# Patient Record
Sex: Male | Born: 1945
Health system: Southern US, Community
[De-identification: ages and names within clinical notes are randomized; demographics above are authoritative.]

## PROBLEM LIST (undated history)

## (undated) DIAGNOSIS — C61 Malignant neoplasm of prostate: Secondary | ICD-10-CM

## (undated) DIAGNOSIS — E785 Hyperlipidemia, unspecified: Secondary | ICD-10-CM

## (undated) DIAGNOSIS — I10 Essential (primary) hypertension: Secondary | ICD-10-CM

## (undated) HISTORY — PX: FRACTURE SURGERY: SHX138

## (undated) HISTORY — DX: Essential (primary) hypertension: I10

## (undated) HISTORY — DX: Hyperlipidemia, unspecified: E78.5

---

## 2001-08-02 ENCOUNTER — Encounter: Payer: Self-pay | Admitting: Family Medicine

## 2001-08-02 ENCOUNTER — Encounter: Admission: RE | Admit: 2001-08-02 | Discharge: 2001-08-02 | Payer: Self-pay | Admitting: Family Medicine

## 2003-12-05 ENCOUNTER — Ambulatory Visit: Payer: Self-pay | Admitting: Internal Medicine

## 2004-01-08 ENCOUNTER — Ambulatory Visit: Payer: Self-pay | Admitting: Internal Medicine

## 2009-02-04 ENCOUNTER — Ambulatory Visit: Payer: Self-pay | Admitting: Internal Medicine

## 2009-02-04 LAB — CONVERTED CEMR LAB
ALT: 26 units/L (ref 0–53)
AST: 21 units/L (ref 0–37)
Albumin: 4 g/dL (ref 3.5–5.2)
Alkaline Phosphatase: 55 units/L (ref 39–117)
BUN: 15 mg/dL (ref 6–23)
Basophils Absolute: 0 10*3/uL (ref 0.0–0.1)
Basophils Relative: 0 % (ref 0.0–3.0)
Bilirubin Urine: NEGATIVE
Bilirubin, Direct: 0.1 mg/dL (ref 0.0–0.3)
CO2: 30 meq/L (ref 19–32)
Calcium: 9 mg/dL (ref 8.4–10.5)
Chloride: 104 meq/L (ref 96–112)
Cholesterol: 258 mg/dL — ABNORMAL HIGH (ref 0–200)
Creatinine, Ser: 1.1 mg/dL (ref 0.4–1.5)
Direct LDL: 187.8 mg/dL
Eosinophils Absolute: 0.1 10*3/uL (ref 0.0–0.7)
Eosinophils Relative: 1.5 % (ref 0.0–5.0)
GFR calc non Af Amer: 72.2 mL/min (ref >60)
Glucose, Bld: 106 mg/dL — ABNORMAL HIGH (ref 70–99)
HCT: 48.3 % (ref 39.0–52.0)
HDL: 49.3 mg/dL (ref >39.00)
Hemoglobin, Urine: NEGATIVE
Hemoglobin: 16.8 g/dL (ref 13.0–17.0)
Ketones, ur: NEGATIVE mg/dL
Leukocytes, UA: NEGATIVE
Lymphocytes Relative: 19.2 % (ref 12.0–46.0)
Lymphs Abs: 1.2 10*3/uL (ref 0.7–4.0)
MCHC: 34.7 g/dL (ref 30.0–36.0)
MCV: 98.1 fL (ref 78.0–100.0)
Monocytes Absolute: 0.3 10*3/uL (ref 0.1–1.0)
Monocytes Relative: 5.3 % (ref 3.0–12.0)
Neutro Abs: 4.6 10*3/uL (ref 1.4–7.7)
Neutrophils Relative %: 74 % (ref 43.0–77.0)
Nitrite: NEGATIVE
Platelets: 191 10*3/uL (ref 150.0–400.0)
Potassium: 4.8 meq/L (ref 3.5–5.1)
RBC: 4.93 M/uL (ref 4.22–5.81)
RDW: 11.7 % (ref 11.5–14.6)
Sodium: 139 meq/L (ref 135–145)
Specific Gravity, Urine: 1.01 (ref 1.000–1.030)
TSH: 2.11 microintl units/mL (ref 0.35–5.50)
Total Bilirubin: 0.7 mg/dL (ref 0.3–1.2)
Total CHOL/HDL Ratio: 5
Total Protein, Urine: NEGATIVE mg/dL
Total Protein: 6.9 g/dL (ref 6.0–8.3)
Triglycerides: 107 mg/dL (ref 0.0–149.0)
Urine Glucose: NEGATIVE mg/dL
Urobilinogen, UA: 0.2 (ref 0.0–1.0)
VLDL: 21.4 mg/dL (ref 0.0–40.0)
WBC: 6.2 10*3/uL (ref 4.5–10.5)
pH: 7 (ref 5.0–8.0)

## 2009-02-08 ENCOUNTER — Ambulatory Visit: Payer: Self-pay | Admitting: Internal Medicine

## 2009-02-08 DIAGNOSIS — E785 Hyperlipidemia, unspecified: Secondary | ICD-10-CM

## 2009-02-08 DIAGNOSIS — I10 Essential (primary) hypertension: Secondary | ICD-10-CM | POA: Insufficient documentation

## 2010-03-04 NOTE — Assessment & Plan Note (Signed)
Summary: CPX / LOV 2005 /[facility]   #   Vital Signs:  Patient profile:   65 year old male Height:      70 inches Weight:      206 pounds BMI:     29.66 O2 Sat:      95 % on Room air Temp:     98.6 degrees F oral Pulse rate:   90 / minute BP sitting:   152 / 96  (left arm) Cuff size:   large  Vitals Entered By: Bill Salinas CMA (February 08, 2009 1:26 PM)  O2 Flow:  Room air CC: pt here for CPX/ ab   CC:  pt here for CPX/ ab.  History of Present Illness: Patient presents for routine medical follow-up. He was last seen by Me in '05. Interval history is unremarkable. He has been healthy.  Current Medications (verified): 1)  None  Allergies (verified): No Known Drug Allergies  Past History:  Past Medical History: UCD None  Past Surgical History: None  fx  left wrist and arm remotely  Family History: father - CAD/MI @ 62 mother - CAD/MI @ 35, DM Neg - colon or prostate cancer  Social History: HSG, College Married 3 yrs - divorced; married 24 years-divorced. Long-term monogamous relationship 2 dtrs - '70, '79 work - Hydrographic surveyor  Review of Systems  The patient denies anorexia, fever, weight loss, weight gain, vision loss, decreased hearing, hoarseness, chest pain, syncope, dyspnea on exertion, peripheral edema, prolonged cough, headaches, hemoptysis, abdominal pain, melena, hematochezia, severe indigestion/heartburn, hematuria, incontinence, muscle weakness, suspicious skin lesions, difficulty walking, depression, unusual weight change, abnormal bleeding, enlarged lymph nodes, and testicular masses.    Physical Exam  General:  Well-developed,well-nourished,in no acute distress; alert,appropriate and cooperative throughout examination Head:  Normocephalic and atraumatic without obvious abnormalities. No apparent alopecia or balding. Eyes:  No corneal or conjunctival inflammation noted. EOMI. Perrla. Funduscopic exam benign, without  hemorrhages, exudates or papilledema. Vision grossly normal. Ears:  External ear exam shows no significant lesions or deformities.  Otoscopic examination reveals clear canals, tympanic membranes are intact bilaterally without bulging, retraction, inflammation or discharge. Hearing is grossly normal bilaterally. Nose:  External nasal examination shows no deformity or inflammation. Nasal mucosa are pink and moist without lesions or exudates. Mouth:  Oral mucosa and oropharynx without lesions or exudates.  Teeth in good repair. Neck:  No deformities, masses, or tenderness noted. Chest Wall:  No deformities, masses, tenderness or gynecomastia noted. Lungs:  Normal respiratory effort, chest expands symmetrically. Lungs are clear to auscultation, no crackles or wheezes. Heart:  Normal rate and regular rhythm. S1 and S2 normal without gallop, murmur, click, rub or other extra sounds. Abdomen:  Bowel sounds positive,abdomen soft and non-tender without masses, organomegaly or hernias noted. Prostate:  Prostate gland firm and smooth, no enlargement, nodularity, tenderness, mass, asymmetry or induration. Msk:  No deformity or scoliosis noted of thoracic or lumbar spine.   Pulses:  R and L carotid,radial,femoral,dorsalis pedis and posterior tibial pulses are full and equal bilaterally Extremities:  No clubbing, cyanosis, edema, or deformity noted with normal full range of motion of all joints.   Neurologic:  No cranial nerve deficits noted. Station and gait are normal. Plantar reflexes are down-going bilaterally. DTRs are symmetrical throughout. Sensory, motor and coordinative functions appear intact. Skin:  Intact without suspicious lesions or rashes Cervical Nodes:  no anterior cervical adenopathy and no posterior cervical adenopathy.   Psych:  Cognition and judgment appear intact.  Alert and cooperative with normal attention span and concentration. No apparent delusions, illusions,  hallucinations   Impression & Recommendations:  Problem # 1:  HYPERLIPIDEMIA (ICD-272.4) Patient with a very high LDL at 187. Discussed risks of high cholesterol. LDL in '05 158  Plan - attempt at life-style management, i.e. diet and exercise.            recheck lab in 3-4 months  Problem # 2:  ESSENTIAL HYPERTENSION (ICD-401.9) BP elevated and was elevated in the past  Plan - start therapy with beta-blocker           recheck BP in 3-4 weeks.   His updated medication list for this problem includes:    Toprol Xl 25 Mg Xr24h-tab (Metoprolol succinate) .Marland Kitchen... 1 by mouth once daily for blood pressure  Problem # 3:  Preventive Health Care (ICD-V70.0) Generally n good health. Labs except for cholesterol were normal. Will refer for colonosocopy. He declines immunization.  In summary  - a pleasant man who is medically stable who needs to work on lowering cholesterol and blood pressure.   Complete Medication List: 1)  Viagra 100 Mg Tabs (Sildenafil citrate) .Marland Kitchen.. 1 by mouth  as needed 2)  Toprol Xl 25 Mg Xr24h-tab (Metoprolol succinate) .Marland Kitchen.. 1 by mouth once daily for blood pressure   Patient: Benjamin Wilkerson Note: All result statuses are Final unless otherwise noted.  Tests: (1) BMP (METABOL)   Sodium                    139 mEq/L                   135-145   Potassium                 4.8 mEq/L                   3.5-5.1   Chloride                  104 mEq/L                   96-112   Carbon Dioxide            30 mEq/L                    19-32   Glucose              [H]  106 mg/dL                   16-10   BUN                       15 mg/dL                    9-60   Creatinine                1.1 mg/dL                   4.5-4.0   Calcium                   9.0 mg/dL                   9.8-11.9   GFR                       72.20 mL/min                >  60  Tests: (2) Hepatic/Liver Function Panel (HEPATIC)   Total Bilirubin           0.7 mg/dL                   1.1-9.1   Direct Bilirubin           0.1 mg/dL                   4.7-8.2   Alkaline Phosphatase      55 U/L                      39-117   AST                       21 U/L                      0-37   ALT                       26 U/L                      0-53   Total Protein             6.9 g/dL                    9.5-6.2   Albumin                   4.0 g/dL                    1.3-0.8  Tests: (3) TSH (TSH)   FastTSH                   2.11 uIU/mL                 0.35-5.50  Tests: (4) Lipid Panel (LIPID)   Cholesterol          [H]  258 mg/dL                   6-578     ATP III Classification            Desirable:  < 200 mg/dL                    Borderline High:  200 - 239 mg/dL               High:  > = 240 mg/dL   Triglycerides             107.0 mg/dL                 4.6-962.9     Normal:  <150 mg/dL     Borderline High:  528 - 199 mg/dL   HDL                       41.32 mg/dL                 >44.01   VLDL Cholesterol          21.4 mg/dL                  0.2-72.5  CHO/HDL Ratio:  CHD Risk  5                    Men          Women     1/2 Average Risk     3.4          3.3     Average Risk          5.0          4.4     2X Average Risk          9.6          7.1     3X Average Risk          15.0          11.0                           Tests: (5) CBC Platelet w/Diff (CBCD)   White Cell Count          6.2 K/uL                    4.5-10.5   Red Cell Count            4.93 Mil/uL                 4.22-5.81   Hemoglobin                16.8 g/dL                   81.1-91.4   Hematocrit                48.3 %                      39.0-52.0   MCV                       98.1 fl                     78.0-100.0   MCHC                      34.7 g/dL                   78.2-95.6   RDW                       11.7 %                      11.5-14.6   Platelet Count            191.0 K/uL                  150.0-400.0   Neutrophil %              74.0 %                      43.0-77.0   Lymphocyte %              19.2 %                       12.0-46.0   Monocyte %                5.3 %  3.0-12.0   Eosinophils%              1.5 %                       0.0-5.0   Basophils %               0.0 %                       0.0-3.0   Neutrophill Absolute      4.6 K/uL                    1.4-7.7   Lymphocyte Absolute       1.2 K/uL                    0.7-4.0   Monocyte Absolute         0.3 K/uL                    0.1-1.0  Eosinophils, Absolute                             0.1 K/uL                    0.0-0.7   Basophils Absolute        0.0 K/uL                    0.0-0.1  Tests: (6) UDip Only (UDIP)   Color                     LT. YELLOW       RANGE:  Yellow;Lt. Yellow   Clarity                   CLEAR                       Clear   Specific Gravity          1.010                       1.000 - 1.030   Urine Ph                  7.0                         5.0-8.0   Protein                   NEGATIVE                    Negative   Urine Glucose             NEGATIVE                    Negative   Ketones                   NEGATIVE                    Negative   Urine Bilirubin           NEGATIVE                    Negative   Blood  NEGATIVE                    Negative   Urobilinogen              0.2                         0.0 - 1.0   Leukocyte Esterace        NEGATIVE                    Negative   Nitrite                   NEGATIVE                    Negative  Tests: (7) Cholesterol LDL - Direct (DIRLDL)  Cholesterol LDL - Direct                             187.8 mg/dL     Optimal:  <782 mg/dL     Near or Above Optimal:  100-129 mg/dL     Borderline High:  956-213 mg/dL     High:  086-578 mg/dL     Very High:  >469 mg/dLPrescriptions: TOPROL XL 25 MG XR24H-TAB (METOPROLOL SUCCINATE) 1 by mouth once daily for blood pressure  #30 x 12   Entered and Authorized by:   Jacques Navy MD   Signed by:   Jacques Navy MD on 02/08/2009   Method used:   Print then Give to Patient    RxID:   [id number] VIAGRA 100 MG TABS (SILDENAFIL CITRATE) 1 by mouth  as needed  #6 x 12   Entered and Authorized by:   Jacques Navy MD   Signed by:   Jacques Navy MD on 02/08/2009   Method used:   Print then Give to Patient   RxID:   [id number]    Not Administered:    Influenza Vaccine not given due to: declined

## 2010-09-09 ENCOUNTER — Other Ambulatory Visit: Payer: Self-pay | Admitting: Internal Medicine

## 2011-02-25 ENCOUNTER — Other Ambulatory Visit: Payer: Self-pay | Admitting: Internal Medicine

## 2011-08-26 ENCOUNTER — Other Ambulatory Visit: Payer: Self-pay | Admitting: Internal Medicine

## 2011-11-23 ENCOUNTER — Ambulatory Visit (INDEPENDENT_AMBULATORY_CARE_PROVIDER_SITE_OTHER): Payer: PRIVATE HEALTH INSURANCE | Admitting: Internal Medicine

## 2011-11-23 ENCOUNTER — Encounter: Payer: Self-pay | Admitting: Internal Medicine

## 2011-11-23 VITALS — BP 132/92 | HR 90 | Temp 98.6°F | Resp 16 | Wt 215.0 lb

## 2011-11-23 DIAGNOSIS — N529 Male erectile dysfunction, unspecified: Secondary | ICD-10-CM

## 2011-11-23 DIAGNOSIS — E785 Hyperlipidemia, unspecified: Secondary | ICD-10-CM

## 2011-11-23 DIAGNOSIS — Z0000 Encounter for general adult medical examination without abnormal findings: Secondary | ICD-10-CM

## 2011-11-23 DIAGNOSIS — I10 Essential (primary) hypertension: Secondary | ICD-10-CM

## 2011-11-23 MED ORDER — TADALAFIL 20 MG PO TABS: 20.0000 mg | ORAL_TABLET | Freq: Every day | ORAL | Status: DC | PRN

## 2011-11-23 MED ORDER — METOPROLOL SUCCINATE ER 25 MG PO TB24: 50.0000 mg | ORAL_TABLET | Freq: Every day | ORAL | Status: DC

## 2011-11-23 NOTE — Patient Instructions (Addendum)
Physical exam is normal but there is a small palpable defect in the left inguinal canal. If there is a bulge more frequently and if it is larger that will needed elective evaluation. If there is a bulge that does not go back in (reduce) and is tender this is a medical emergency and will need immediate attention.   Blood pressure not to bad today but has been higher than goal. Plan is to increase the toprol Xl to 50 mg once a day. Watch for feeling really sluggish or having a heart rate lower than 65  ; if this happens we will readjust your medications.  Cholesterol - last checked in '11 - LDL was 187 - definitely in the treatment range, especially with both parents having had heart disease. Please forward to me copy of you up coming lab work - will base recommendations on that information.  You need to have a colonoscopy - let me know if you have coverage and want to proceed.  Immunization: you should have pneumonia vaccine and shingles vaccine.   Thanks for coming back. Don't forget to have all lab work and EKG forwarded to me.

## 2011-11-23 NOTE — Progress Notes (Signed)
Subjective:    Patient ID: Benjamin Wilkerson, male    DOB: Aug 29, 1945, 66 y.o.   MRN: [id number]  HPI Benjamin Wilkerson was last seen in Jan '11. He was to return for follow up of hyperlipidemia and hypertension but failed to return. He does report having annual physical exams at work, including lab work which has revealed continued elevated cholesterol. He has been feeling well. He has no other medical problems.  Past Medical History  Diagnosis Date  . Hyperlipidemia   . Hypertension    Past Surgical History  Procedure Date  . Fracture surgery     left wrist and arm   Family History  Problem Relation Age of Onset  . Heart disease Mother     CAD/MI  . Diabetes Mother   . Heart disease Father     CAD/MI  . Alcohol abuse Father   . Cancer Neg Hx   . COPD Neg Hx    History   Social History  . Marital Status: Divorced    Spouse Name: N/A    Number of Children: 2  . Years of Education: 16   Occupational History  . Environmental serivces -Surveyor, quantity    Social History Main Topics  . Smoking status: Never Smoker   . Smokeless tobacco: Never Used  . Alcohol Use: 3.5 - 7.0 oz/week    7-14 drink(s) per week  . Drug Use: No  . Sexually Active: Yes -- Male partner(s)   Other Topics Concern  . Not on file   Social History Narrative   HSG, COLLEGE. MARRIED 3 YEARS, DIVORCED,  MARRIED 80 YEARS - DIVORCED. LONG TERM MONOGAMOUS RELATIONSHIP. 2 DAUGHTERS  1970 ; 1979.  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     Current Outpatient Prescriptions on File Prior to Visit  Medication Sig Dispense Refill  . metoprolol succinate (TOPROL-XL) 25 MG 24 hr tablet TAKE 1 TABLET BY MOUTH EVERY DAY FOR BLOOD PRESSURE  30 tablet  5       Review of Systems Constitutional:  Negative for fever, chills, activity change and unexpected weight change.  HEENT:  Negative for hearing loss, ear pain, congestion, neck stiffness and postnasal drip. Negative for sore  throat or swallowing problems. Negative for dental complaints.   Eyes: Negative for vision loss or change in visual acuity.  Respiratory: Negative for chest tightness and wheezing. Negative for DOE.   Cardiovascular: Negative for chest pain or palpitations. No decreased exercise tolerance Gastrointestinal: No change in bowel habit. No bloating or gas. No reflux or indigestion Genitourinary: Negative for urgency, frequency, flank pain and difficulty urinating.  Musculoskeletal: Negative for myalgias, back pain, arthralgias and gait problem.  Neurological: Negative for dizziness, tremors, weakness and headaches.  Hematological: Negative for adenopathy.  Psychiatric/Behavioral: Negative for behavioral problems and dysphoric mood.       Objective:   Physical Exam Filed Vitals:   11/23/11 1316  BP: 132/92  Pulse: 90  Temp: 98.6 F (37 C)  Resp: 16   Wt Readings from Last 3 Encounters:  11/23/11 215 lb (97.523 kg)  02/08/09 206 lb (93.441 kg)   Gen'l: Well nourished well developed white male in no acute distress  HEENT: Head: Normocephalic and atraumatic. Right Ear: External ear normal. EAC/TM nl. Left Ear: External ear normal.  EAC/TM nl. Nose: Nose normal. Mouth/Throat: Oropharynx is clear and moist. Dentition - native, in good repair. No buccal or palatal lesions. Posterior pharynx clear. Eyes: Conjunctivae and sclera clear. EOM intact. Pupils  are equal, round, and reactive to light. Right eye exhibits no discharge. Left eye exhibits no discharge. Neck: Normal range of motion. Neck supple. No JVD present. No tracheal deviation present. No thyromegaly present.  Cardiovascular: Normal rate, regular rhythm, no gallop, no friction rub, no murmur heard.      Quiet precordium. 2+ radial and DP pulses . No carotid bruits Pulmonary/Chest: Effort normal. No respiratory distress or increased WOB, no wheezes, no rales. No chest wall deformity or CVAT. Abdomen: Soft. Bowel sounds are normal in all  quadrants. He exhibits no distension, no tenderness, no rebound or guarding, No heptosplenomegaly  Genitourinary:  normal phallus, small inguinal defect left on digital exam w/o tenderness Musculoskeletal: Normal range of motion. He exhibits no edema and no tenderness.       Small and large joints without redness, synovial thickening or deformity. Full range of motion preserved about all small, median and large joints.  Lymphadenopathy:    He has no cervical or supraclavicular adenopathy.  Neurological: He is alert and oriented to person, place, and time. CN II-XII intact. DTRs 2+ and symmetrical biceps, radial and patellar tendons. Cerebellar function normal with no tremor, rigidity, normal gait and station.  Skin: Skin is warm and dry. No rash noted. No erythema.  Psychiatric: He has a normal mood and affect. His behavior is normal. Thought content normal.         Assessment & Plan:

## 2011-11-24 DIAGNOSIS — Z0000 Encounter for general adult medical examination without abnormal findings: Secondary | ICD-10-CM | POA: Insufficient documentation

## 2011-11-24 DIAGNOSIS — N529 Male erectile dysfunction, unspecified: Secondary | ICD-10-CM | POA: Insufficient documentation

## 2011-11-24 NOTE — Assessment & Plan Note (Signed)
LDL 187 on last in office lab. He reports labs done at work continue to reflect elevated cholesterol  Plan Scheduled for work physical in Nov '13 - he will forward lab results. For LDL greater than 130 he will need medical management

## 2011-11-24 NOTE — Assessment & Plan Note (Signed)
He has had success with viagra in the past. Discussed other products  Plan  Trial of cialis 20 mg prn. Rx provided

## 2011-11-24 NOTE — Assessment & Plan Note (Signed)
Interval medical history is without major illness, surgery or injury. Physical exam is normal. He has labs done at work - he will forward up-coming lab results. He will be scheduled for colorectal cancer screening - he wants to check insurance coverage and will call back. He is current with PSA screening at work. Immunizations - he is advised to have shingles and pneumonia vaccines.  In summary - a nice man who appears healthy and active. He will return in 1-2 months for BP check and to review employment provided lab work.

## 2011-11-24 NOTE — Assessment & Plan Note (Signed)
BP Readings from Last 3 Encounters:  11/23/11 132/92  02/08/09 152/96   Better reading than in the past but he reports elevated BP during work physicals. He is also mildly tachycardic.  PLan  Increase Toprolol XL to 50 mg daily. He is advised to watch for increased "sluggishness" or bradycardia - he is instructed in taking a pulse.

## 2012-04-27 ENCOUNTER — Other Ambulatory Visit: Payer: Self-pay | Admitting: Internal Medicine

## 2012-09-30 ENCOUNTER — Other Ambulatory Visit: Payer: Self-pay | Admitting: Internal Medicine

## 2013-04-23 ENCOUNTER — Other Ambulatory Visit: Payer: Self-pay | Admitting: Internal Medicine

## 2013-11-10 ENCOUNTER — Ambulatory Visit (INDEPENDENT_AMBULATORY_CARE_PROVIDER_SITE_OTHER): Payer: PRIVATE HEALTH INSURANCE | Admitting: Family

## 2013-11-10 ENCOUNTER — Encounter: Payer: Self-pay | Admitting: Family

## 2013-11-10 VITALS — BP 118/80 | HR 64 | Temp 98.4°F | Resp 18 | Ht 70.0 in | Wt 219.2 lb

## 2013-11-10 DIAGNOSIS — N529 Male erectile dysfunction, unspecified: Secondary | ICD-10-CM

## 2013-11-10 DIAGNOSIS — I10 Essential (primary) hypertension: Secondary | ICD-10-CM

## 2013-11-10 DIAGNOSIS — Z23 Encounter for immunization: Secondary | ICD-10-CM

## 2013-11-10 MED ORDER — TADALAFIL 20 MG PO TABS: 20.0000 mg | ORAL_TABLET | Freq: Every day | ORAL | Status: DC | PRN

## 2013-11-10 MED ORDER — METOPROLOL SUCCINATE ER 50 MG PO TB24: 50.0000 mg | ORAL_TABLET | Freq: Every day | ORAL | Status: DC

## 2013-11-10 NOTE — Assessment & Plan Note (Signed)
Blood pressure remains controlled on current regimen. Continue current plan. Medications refilled.

## 2013-11-10 NOTE — Patient Instructions (Signed)
Thank you for choosing [facility].  Summary/Instructions:   Your medication refills have been sent to your pharmacy  Please schedule a time for a physical at your convenience.   Return to the office as needed.  Thank you for enrolling in [place]. Please follow the instructions below to securely access your online medical record. MyChart allows you to send messages to your doctor, view your test results, renew your prescriptions, schedule appointments, and more.  How Do I Sign Up? 1. In your [employer], go to [email or web address]. 2. Click on the New  User? link in the Sign In box.  3. Enter your MyChart Access Code exactly as it appears below. You will not need to use this code after you have completed the sign-up process. If you do not sign up before the expiration date, you must request a new code. MyChart Access Code: [email or web address] Expires: 01/09/2014  1:26 PM  4. Enter the last four digits of your Social Security Number (xxxx) and Date of Birth (mm/dd/yyyy) as indicated and click Next. You will be taken to the next sign-up page. 5. Create a MyChart ID. This will be your MyChart login ID and cannot be changed, so think of one that is secure and easy to remember. 6. Create a MyChart password. You can change your password at any time. 7. Enter your Password Reset Question and Answer and click Next. This can be used at a later time if you forget your password.  8. Select your communication preference, and if applicable enter your e-mail address. You will receive e-mail notification when new information is available in MyChart by choosing to receive e-mail notifications and filling in your e-mail. 9. Click Sign In. You can now view your medical record.   Additional Information If you have questions, you can email [email or web address] or call [phone] to talk to our [place] staff. Remember, MyChart is NOT to be used for urgent needs. For medical  emergencies, dial 911.

## 2013-11-10 NOTE — Assessment & Plan Note (Signed)
Currently stable on current regimen. Continue Cialis as needed. Medication refilled.

## 2013-11-10 NOTE — Progress Notes (Signed)
   Subjective:    Patient ID: Benjamin Wilkerson, male    DOB: 1945-04-09, 68 y.o.   MRN: [id number]  HPI:  Benjamin Wilkerson is a 68 y.o. male who presents today to establish as a new patient for this provider and  for blood pressure follow-up.   1) Blood pressure - Currently taking 50 mg Metoprolol daily. Denies any side effects. Denies chest pain/discomfort, shortness of breath, palpitation.  BP Readings from Last 3 Encounters:  11/10/13 118/80  11/23/11 132/92  02/08/09 152/96    2) Erectile Dysfunction - Been treated on Cialis 20 mg just as needed. Denies any adverse side effects. Requesting refill of medication  3) Flu shot requested by patient  No Known Allergies   Current outpatient prescriptions:metoprolol succinate (TOPROL-XL) 50 MG 24 hr tablet, Take 1 tablet (50 mg total) by mouth daily. Take with or immediately following a meal., Disp: 90 tablet, Rfl: 3;  tadalafil (CIALIS) 20 MG tablet, Take 1 tablet (20 mg total) by mouth daily as needed for erectile dysfunction., Disp: 6 tablet, Rfl: 5  Past Medical History  Diagnosis Date  . Hyperlipidemia   . Hypertension     Review of Systems     See HPI  Objective:    BP 118/80  Pulse 64  Temp(Src) 98.4 F (36.9 C) (Oral)  Resp 18  Ht 5\' 10"  (1.778 m)  Wt 219 lb 3.2 oz (99.428 kg)  BMI 31.45 kg/m2  SpO2 97%  Nursing note and vital signs reviewed.  Physical Exam  Constitutional: He is oriented to person, place, and time. He appears well-developed and well-nourished.  Cardiovascular: Normal rate, regular rhythm and normal heart sounds.   Pulmonary/Chest: Effort normal and breath sounds normal.  Neurological: He is alert and oriented to person, place, and time.  Skin: Skin is warm and dry.  Psychiatric: He has a normal mood and affect. His behavior is normal. Judgment and thought content normal.        Assessment & Plan:

## 2013-11-10 NOTE — Progress Notes (Signed)
Pre visit review using our clinic review tool, if applicable. No additional management support is needed unless otherwise documented below in the visit note. 

## 2013-11-13 ENCOUNTER — Telehealth: Payer: Self-pay | Admitting: Family

## 2013-11-13 NOTE — Telephone Encounter (Signed)
emmi mailed  °

## 2014-03-23 ENCOUNTER — Other Ambulatory Visit: Payer: Self-pay

## 2014-03-23 MED ORDER — METOPROLOL SUCCINATE ER 50 MG PO TB24: 50.0000 mg | ORAL_TABLET | Freq: Every day | ORAL | Status: DC

## 2014-11-20 ENCOUNTER — Other Ambulatory Visit: Payer: Self-pay | Admitting: Family

## 2015-02-18 ENCOUNTER — Other Ambulatory Visit: Payer: Self-pay | Admitting: Family

## 2015-05-20 ENCOUNTER — Other Ambulatory Visit: Payer: Self-pay | Admitting: Family

## 2015-08-11 ENCOUNTER — Other Ambulatory Visit: Payer: Self-pay | Admitting: Family

## 2015-11-07 ENCOUNTER — Other Ambulatory Visit: Payer: Self-pay | Admitting: Family

## 2015-12-17 ENCOUNTER — Encounter: Payer: Self-pay | Admitting: Family

## 2015-12-17 ENCOUNTER — Ambulatory Visit (INDEPENDENT_AMBULATORY_CARE_PROVIDER_SITE_OTHER): Payer: Medicare Other | Admitting: Family

## 2015-12-17 VITALS — BP 148/88 | HR 76 | Temp 98.3°F | Resp 16 | Ht 70.0 in | Wt 234.0 lb

## 2015-12-17 DIAGNOSIS — I10 Essential (primary) hypertension: Secondary | ICD-10-CM

## 2015-12-17 DIAGNOSIS — N529 Male erectile dysfunction, unspecified: Secondary | ICD-10-CM | POA: Diagnosis not present

## 2015-12-17 MED ORDER — SILDENAFIL CITRATE 100 MG PO TABS: 50.0000 mg | ORAL_TABLET | Freq: Every day | ORAL | 2 refills | Status: DC | PRN

## 2015-12-17 MED ORDER — METOPROLOL SUCCINATE ER 50 MG PO TB24: ORAL_TABLET | ORAL | 1 refills | Status: DC

## 2015-12-17 NOTE — Assessment & Plan Note (Signed)
Erectile dysfunction appears adequately controlled with current regimen, however patient believes Viagra tends to work better for him. Discontinue Cialis. Start Viagra. Advised of proper medication usage and potential risks/side effects. Continue to monitor.

## 2015-12-17 NOTE — Patient Instructions (Addendum)
Thank you for choosing [facility].  SUMMARY AND INSTRUCTIONS:  Medication:  Please continue to take your medications as prescribed.   Your prescription(s) have been submitted to your pharmacy or been printed and provided for you. Please take as directed and contact our office if you believe you are having problem(s) with the medication(s) or have any questions.  Follow up:  If your symptoms worsen or fail to improve, please contact our office for further instruction, or in case of emergency go directly to the emergency room at the closest medical facility.     

## 2015-12-17 NOTE — Assessment & Plan Note (Signed)
Hypertension appears adequately controlled current regimen and below goal 150/90 with no adverse side effects. Denies worse headache of life and no new symptoms of end organ damage noted upon exam. Continue current dosage of metoprolol. Encouraged to monitor blood pressure at home and follow low-sodium diet. Continue to monitor.

## 2015-12-17 NOTE — Progress Notes (Signed)
Subjective:    Patient ID: Benjamin Wilkerson, male    DOB: 13-Mar-1945, 70 y.o.   MRN: [id number]  Chief Complaint  Patient presents with  . Medication Refill    medication refill and wants cialis changed to viagra    HPI:  Benjamin Wilkerson is a 70 y.o. male who  has a past medical history of Hyperlipidemia and Hypertension. and presents today for a follow up.  1.) Hypertension - currently maintained on metoprolol. Reports taken medications as prescribed and denies adverse side effects or hypotensive readings. Not currently taking his blood pressure at home. Not currently on a low sodium diet.   BP Readings from Last 3 Encounters:  12/17/15 (!) 148/88  11/10/13 118/80  11/23/11 (!) 132/92    2.) Erectile dysfunction - currently maintained on Cialis. Reports taking medication as prescribed and denies adverse side effects. Symptoms radically controlled with Cialis but interested in changing to Viagra as he feels the medication works better.    No Known Allergies    Outpatient Medications Prior to Visit  Medication Sig Dispense Refill  . metoprolol succinate (TOPROL-XL) 50 MG 24 hr tablet TAKE 1 TABLET (50 MG TOTAL) BY MOUTH DAILY. TAKE WITH OR IMMEDIATELY FOLLOWING A MEAL. 90 tablet 0  . tadalafil (CIALIS) 20 MG tablet Take 1 tablet (20 mg total) by mouth daily as needed for erectile dysfunction. 6 tablet 5   No facility-administered medications prior to visit.       Review of Systems  Constitutional: Negative for chills and fever.  Eyes:       Negative for changes in vision  Respiratory: Negative for cough, chest tightness and wheezing.   Cardiovascular: Negative for chest pain, palpitations and leg swelling.  Genitourinary:       Positive for erectile dysfunction.  Neurological: Negative for dizziness, weakness and light-headedness.      Objective:    BP (!) 148/88 (BP Location: Left Arm, Patient Position: Sitting, Cuff Size: Large)   Pulse 76   Temp 98.3 F (36.8  C) (Oral)   Resp 16   Ht 5\' 10"  (1.778 m)   Wt 234 lb (106.1 kg)   SpO2 96%   BMI 33.58 kg/m  Nursing note and vital signs reviewed.  Physical Exam  Constitutional: He is oriented to person, place, and time. He appears well-developed and well-nourished. No distress.  Cardiovascular: Normal rate, regular rhythm, normal heart sounds and intact distal pulses.   Pulmonary/Chest: Effort normal and breath sounds normal.  Neurological: He is alert and oriented to person, place, and time.  Skin: Skin is warm and dry.  Psychiatric: He has a normal mood and affect. His behavior is normal. Judgment and thought content normal.       Assessment & Plan:   Problem List Items Addressed This Visit      Cardiovascular and Mediastinum   Essential hypertension - Primary    Hypertension appears adequately controlled current regimen and below goal 150/90 with no adverse side effects. Denies worse headache of life and no new symptoms of end organ damage noted upon exam. Continue current dosage of metoprolol. Encouraged to monitor blood pressure at home and follow low-sodium diet. Continue to monitor.      Relevant Medications   aspirin EC 81 MG tablet   sildenafil (VIAGRA) 100 MG tablet   metoprolol succinate (TOPROL-XL) 50 MG 24 hr tablet     Genitourinary   ED (erectile dysfunction)    Erectile dysfunction appears adequately controlled with  current regimen, however patient believes Viagra tends to work better for him. Discontinue Cialis. Start Viagra. Advised of proper medication usage and potential risks/side effects. Continue to monitor.      Relevant Medications   sildenafil (VIAGRA) 100 MG tablet       I have discontinued Mr. Byce tadalafil. I am also having him start on sildenafil. Additionally, I am having him maintain his aspirin EC and metoprolol succinate.   Meds ordered this encounter  Medications  . aspirin EC 81 MG tablet    Sig: Take 81 mg by mouth daily.  . sildenafil  (VIAGRA) 100 MG tablet    Sig: Take 0.5-1 tablets (50-100 mg total) by mouth daily as needed for erectile dysfunction.    Dispense:  3 tablet    Refill:  2    Order Specific Question:   Supervising Provider    Answer:   Pricilla Holm A [id number]  . metoprolol succinate (TOPROL-XL) 50 MG 24 hr tablet    Sig: TAKE 1 TABLET (50 MG TOTAL) BY MOUTH DAILY. TAKE WITH OR IMMEDIATELY FOLLOWING A MEAL.    Dispense:  90 tablet    Refill:  1    Order Specific Question:   Supervising Provider    Answer:   Pricilla Holm A [id number]     Follow-up: Return in about 6 months (around 06/15/2016), or if symptoms worsen or fail to improve.  Mauricio Po, FNP

## 2016-03-23 ENCOUNTER — Other Ambulatory Visit: Payer: Self-pay | Admitting: Family

## 2016-03-23 DIAGNOSIS — I10 Essential (primary) hypertension: Secondary | ICD-10-CM

## 2017-03-05 ENCOUNTER — Other Ambulatory Visit: Payer: Self-pay | Admitting: Family

## 2017-03-05 DIAGNOSIS — I10 Essential (primary) hypertension: Secondary | ICD-10-CM

## 2017-03-15 ENCOUNTER — Other Ambulatory Visit: Payer: Self-pay | Admitting: *Deleted

## 2017-03-15 ENCOUNTER — Other Ambulatory Visit: Payer: Self-pay | Admitting: Family

## 2017-03-15 DIAGNOSIS — I10 Essential (primary) hypertension: Secondary | ICD-10-CM

## 2017-03-16 ENCOUNTER — Other Ambulatory Visit: Payer: Self-pay | Admitting: Nurse Practitioner

## 2017-03-16 DIAGNOSIS — I10 Essential (primary) hypertension: Secondary | ICD-10-CM

## 2017-03-16 MED ORDER — METOPROLOL SUCCINATE ER 50 MG PO TB24: ORAL_TABLET | ORAL | 0 refills | Status: DC

## 2017-03-16 NOTE — Telephone Encounter (Signed)
Copied from [place] [id number]. Topic: Inquiry >> Mar 16, 2017 12:20 PM Pricilla Handler wrote: Reason for CRM: Patient called wanting to know if his Metoprolol succinate (TOPROL-XL) 50 MG 24 hr tablet could be refilled ASAP. Patient states that he only has five left. Patient was being seen by Terri Piedra, now will be seen by Caesar Chestnut, but not until May 2019. Patient did call his pharmacy, but they referred him to our office. Someone from the office please call this patient today at [phone].       Thank You!!!

## 2017-03-16 NOTE — Telephone Encounter (Signed)
Called pt to verify pharmacy. Per office policy ok top send enough refills until the appt. Sent rx [facility].Marland KitchenJohny Chess

## 2017-03-16 NOTE — Telephone Encounter (Signed)
Copied from [place] [id number]. Topic: Inquiry >> Mar 16, 2017 12:20 PM Pricilla Handler wrote: Reason for CRM: Patient called wanting to know if his Metoprolol succinate (TOPROL-XL) 50 MG 24 hr tablet could be refilled ASAP. Patient states that he only has five left. Patient was being seen by Terri Piedra, now will be seen by Caesar Chestnut, but not until May 2019. Patient did call his pharmacy, but they referred him to our office. Someone from the office please call this patient today at [phone].       Thank You!!!

## 2017-06-12 ENCOUNTER — Other Ambulatory Visit: Payer: Self-pay | Admitting: Nurse Practitioner

## 2017-06-12 DIAGNOSIS — I10 Essential (primary) hypertension: Secondary | ICD-10-CM

## 2017-06-16 ENCOUNTER — Other Ambulatory Visit (INDEPENDENT_AMBULATORY_CARE_PROVIDER_SITE_OTHER): Payer: Medicare Other

## 2017-06-16 ENCOUNTER — Ambulatory Visit (INDEPENDENT_AMBULATORY_CARE_PROVIDER_SITE_OTHER): Payer: Medicare Other | Admitting: Nurse Practitioner

## 2017-06-16 ENCOUNTER — Encounter: Payer: Self-pay | Admitting: Nurse Practitioner

## 2017-06-16 VITALS — BP 132/80 | HR 86 | Temp 98.6°F | Resp 16 | Ht 70.0 in | Wt 254.0 lb

## 2017-06-16 DIAGNOSIS — Z13220 Encounter for screening for lipoid disorders: Secondary | ICD-10-CM | POA: Diagnosis not present

## 2017-06-16 DIAGNOSIS — Z1159 Encounter for screening for other viral diseases: Secondary | ICD-10-CM

## 2017-06-16 DIAGNOSIS — I10 Essential (primary) hypertension: Secondary | ICD-10-CM

## 2017-06-16 DIAGNOSIS — Z9189 Other specified personal risk factors, not elsewhere classified: Secondary | ICD-10-CM

## 2017-06-16 DIAGNOSIS — Z0000 Encounter for general adult medical examination without abnormal findings: Secondary | ICD-10-CM

## 2017-06-16 LAB — LIPID PANEL
CHOL/HDL RATIO: 6
Cholesterol: 276 mg/dL — ABNORMAL HIGH (ref 0–200)
HDL: 44.7 mg/dL (ref >39.00)
LDL Cholesterol: 198 mg/dL — ABNORMAL HIGH (ref 0–99)
NONHDL: 231.05
Triglycerides: 167 mg/dL — ABNORMAL HIGH (ref 0.0–149.0)
VLDL: 33.4 mg/dL (ref 0.0–40.0)

## 2017-06-16 LAB — CBC
HEMATOCRIT: 46.9 % (ref 39.0–52.0)
Hemoglobin: 16.1 g/dL (ref 13.0–17.0)
MCHC: 34.4 g/dL (ref 30.0–36.0)
MCV: 98.3 fl (ref 78.0–100.0)
PLATELETS: 219 10*3/uL (ref 150.0–400.0)
RBC: 4.77 Mil/uL (ref 4.22–5.81)
RDW: 13 % (ref 11.5–15.5)
WBC: 7.7 10*3/uL (ref 4.0–10.5)

## 2017-06-16 LAB — COMPREHENSIVE METABOLIC PANEL
ALT: 29 U/L (ref 0–53)
AST: 16 U/L (ref 0–37)
Albumin: 4.5 g/dL (ref 3.5–5.2)
Alkaline Phosphatase: 60 U/L (ref 39–117)
BILIRUBIN TOTAL: 0.5 mg/dL (ref 0.2–1.2)
BUN: 14 mg/dL (ref 6–23)
CO2: 28 meq/L (ref 19–32)
CREATININE: 1.19 mg/dL (ref 0.40–1.50)
Calcium: 9.3 mg/dL (ref 8.4–10.5)
Chloride: 105 mEq/L (ref 96–112)
GFR: 64.25 mL/min (ref >60.00)
GLUCOSE: 81 mg/dL (ref 70–99)
Potassium: 4.3 mEq/L (ref 3.5–5.1)
Sodium: 141 mEq/L (ref 135–145)
Total Protein: 7.4 g/dL (ref 6.0–8.3)

## 2017-06-16 MED ORDER — METOPROLOL SUCCINATE ER 50 MG PO TB24: ORAL_TABLET | ORAL | 3 refills | Status: DC

## 2017-06-16 NOTE — Assessment & Plan Note (Signed)
BP stable Continue current medications RTC in 1 year for F/U - CBC; Future - Comprehensive metabolic panel; Future - metoprolol succinate (TOPROL-XL) 50 MG 24 hr tablet; TAKE 1 TABLET (50 MG TOTAL) BY MOUTH DAILY. TAKE WITH OR IMMEDIATELY FOLLOWING A MEAL.  Dispense: 90 tablet; Refill: 3

## 2017-06-16 NOTE — Progress Notes (Signed)
Name: Benjamin Wilkerson   MRN: [id number]    DOB: April 08, 1945   Date:06/16/2017       Progress Note  Subjective  Chief Complaint  Chief Complaint  Patient presents with  . Establish Care    blood pressure meds    HPI Benjamin Wilkerson is a 72 yo who presents today requesting a refill of his blood pressure medication. 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 He denies any complaints today.  Hypertension -maintained on metoprolol succinate 50 daily Has been maintained on this medication at this dosage for many years. Reports daily medication compliance without adverse medication effects. Reports he does not routinely check his bp readings at home Denies headaches, vision changes, chest pain, shortness of breath, edema.  BP Readings from Last 3 Encounters:  06/16/17 132/80  12/17/15 (!) 148/88  11/10/13 118/80     Patient Active Problem List   Diagnosis Date Noted  . ED (erectile dysfunction) 11/24/2011  . Routine health maintenance 11/24/2011  . HYPERLIPIDEMIA 02/08/2009  . Essential hypertension 02/08/2009    Past Surgical History:  Procedure Laterality Date  . FRACTURE SURGERY     left wrist and arm    Family History  Problem Relation Age of Onset  . Heart disease Mother        CAD/MI  . Diabetes Mother   . Heart disease Father        CAD/MI  . Alcohol abuse Father   . Cancer Neg Hx   . COPD Neg Hx     Social History   Socioeconomic History  . Marital status: Divorced    Spouse name: Not on file  . Number of children: 2  . Years of education: 34  . Highest education level: Not on file  Occupational History  . Occupation: Geneticist, molecular  Social Needs  . Financial resource strain: Not on file  . Food insecurity:    Worry: Not on file    Inability: Not on file  . Transportation needs:    Medical: Not on file    Non-medical: Not on file  Tobacco Use  . Smoking status: Never  Smoker  . Smokeless tobacco: Never Used  Substance and Sexual Activity  . Alcohol use: Yes    Alcohol/week: 3.5 - 7.0 oz    Types: 7 - 14 Standard drinks or equivalent per week  . Drug use: No  . Sexual activity: Yes    Partners: Female  Lifestyle  . Physical activity:    Days per week: Not on file    Minutes per session: Not on file  . Stress: Not on file  Relationships  . Social connections:    Talks on phone: Not on file    Gets together: Not on file    Attends religious service: Not on file    Active member of club or organization: Not on file    Attends meetings of clubs or organizations: Not on file    Relationship status: Not on file  . Intimate partner violence:    Fear of current or ex partner: Not on file    Emotionally abused: Not on file    Physically abused: Not on file    Forced sexual activity: Not on file  Other Topics Concern  . Not on file  Social History Narrative   HS[employer], [employer]GE. MARRIED 3 YEARS, DIVORCED,  MARRIED 47 YEARS - DIVORCED. LONG TERM MONOGAMOUS  RELATIONSHIP. 2 DAUGHTERS  1970 ; 1979.  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      Current Outpatient Medications:  .  aspirin EC 81 MG tablet, Take 81 mg by mouth daily., Disp: , Rfl:  .  metoprolol succinate (TOPROL-XL) 50 MG 24 hr tablet, TAKE 1 TABLET (50 MG TOTAL) BY MOUTH DAILY. TAKE WITH OR IMMEDIATELY FOLLOWING A MEAL., Disp: 90 tablet, Rfl: 0 .  sildenafil (VIAGRA) 100 MG tablet, Take 0.5-1 tablets (50-100 mg total) by mouth daily as needed for erectile dysfunction., Disp: 3 tablet, Rfl: 2  No Known Allergies   ROS See HPI  Objective  Vitals:   06/16/17 1325  BP: 132/80  Pulse: 86  Resp: 16  Temp: 98.6 F (37 C)  TempSrc: Oral  SpO2: 98%  Weight: 254 lb (115.2 kg)  Height: 5\' 10"  (1.778 m)   Body mass index is 36.45 kg/m.  Physical Exam Vital signs reviewed. Constitutional: Patient appears well-developed and well-nourished. No distress.   HENT: Head: Normocephalic and atraumatic. Nose: Nose normal. Mouth/Throat: Oropharynx is clear and moist. No oropharyngeal exudate.  Eyes: Conjunctivae and EOM are normal. Pupils are equal, round, and reactive to light. No scleral icterus.  Neck: Normal range of motion. Neck supple.  Cardiovascular: Normal rate, regular rhythm and normal heart sounds.  No murmur heard. No BLE edema. Distal pulses intact. Pulmonary/Chest: Effort normal and breath sounds normal. No respiratory distress. Abdominal: Soft. No distension Musculoskeletal: Normal range of motion,  No gross deformities Neurological: He is alert and oriented to person, place, and time. Coordination, balance, strength, speech and gait are normal.  Skin: Skin is warm and dry. No rash noted. No erythema.  Psychiatric: Patient has a normal mood and affect. behavior is normal.   PHQ2/9: Depression screen PHQ 2/9 06/16/2017  Decreased Interest 0  Down, Depressed, Hopeless 0  PHQ - 2 Score 0  Altered sleeping 0  Tired, decreased energy 0  Change in appetite 0  Feeling bad or failure about yourself  0  Trouble concentrating 0  Moving slowly or fidgety/restless 0  Suicidal thoughts 0  PHQ-9 Score 0  Difficult doing work/chores Not difficult at all   Fall Risk: Fall Risk  06/16/2017  Falls in the past year? No    Assessment & Plan RTC in 1 year for annual F/U: HTN, health maintenance  Healthcare maintenance Encouraged AWV with Benjamin Wilkerson He says he is not interested in annual health care or having annual physical, he feels well and does not see why preventive care is necessary. We discussed the importance of annual preventive care in the maintenance of health and prevention of disease and illness. He declines updating immunizations today. He declines PSA screening. He agrees to cholesterol and hepatitis C screening and is interested in cologuard. cologuard information packet provided with instructions to  Verify insurance coverage and  notify our office for cologuard order to be placed one he checks insurance coverage. Encounter for hepatitis C virus screening test for high risk patient- Hepatitis C antibody; Future  Screening for cholesterol level- Fasting today Update lipid panel - Lipid panel; Future

## 2017-06-16 NOTE — Patient Instructions (Addendum)
Please head downstairs for lab work/x-rays. If any of your test results are critically abnormal, you will be contacted right away. Your results may be released to your MyChart for viewing before I am able to provide you with my response. I will contact you within a week about your test results and any recommendations for abnormalities.  If you have medicare related insurance (such as traditional Medicare, Blue [employer], [employer], or similar), Please make an appointment at the scheduling desk with Sharee Pimple, the [employer], for your Wellness visit in this office, which is a benefit with your insurance.  I will plan to see you back in 1 year for annual follow-up, or sooner if needed.  It was nice to meet you. Thanks for letting me take care of you today :)

## 2017-06-17 LAB — HEPATITIS C ANTIBODY
Hepatitis C Ab: NONREACTIVE
SIGNAL TO CUT-OFF: 0.01 (ref <1.00)

## 2017-06-23 ENCOUNTER — Telehealth: Payer: Self-pay | Admitting: Family

## 2017-06-23 NOTE — Telephone Encounter (Signed)
Cholesterol is extremely high.  Please advise pt on low fat/low cholesterol diet/exercise and weight loss.  I would recommend that he start atorvastatin 20mg  once daily #30 with 2 refills and repeat FLP in 3 months.

## 2017-06-24 MED ORDER — ATORVASTATIN CALCIUM 20 MG PO TABS: 20.0000 mg | ORAL_TABLET | Freq: Every day | ORAL | 2 refills | Status: DC

## 2017-06-24 NOTE — Telephone Encounter (Signed)
Pt aware of results. Medications have been sent to requested pharmacy.

## 2017-08-13 ENCOUNTER — Other Ambulatory Visit: Payer: Self-pay | Admitting: Nurse Practitioner

## 2017-11-05 ENCOUNTER — Other Ambulatory Visit: Payer: Self-pay | Admitting: Nurse Practitioner

## 2017-11-05 NOTE — Telephone Encounter (Signed)
Please advise 

## 2018-02-01 ENCOUNTER — Other Ambulatory Visit: Payer: Self-pay | Admitting: Nurse Practitioner

## 2018-05-02 ENCOUNTER — Other Ambulatory Visit: Payer: Self-pay | Admitting: Nurse Practitioner

## 2018-05-02 DIAGNOSIS — I10 Essential (primary) hypertension: Secondary | ICD-10-CM

## 2018-05-02 MED ORDER — METOPROLOL SUCCINATE ER 50 MG PO TB24: ORAL_TABLET | ORAL | 0 refills | Status: DC

## 2018-05-02 NOTE — Telephone Encounter (Signed)
I spoke to patient. He is still taking his Atorvastatin and Metoprolol. He is requesting refills for both. He would like to schedule a wellness with someone a couple months out once the COVID-19 pandemic passes. Refills sent. See meds.

## 2018-05-02 NOTE — Telephone Encounter (Signed)
Is he actually taking this medication? Is he still a patient here? Prescription are being sent to [facility]?

## 2018-07-24 ENCOUNTER — Other Ambulatory Visit: Payer: Self-pay | Admitting: Family

## 2018-07-24 DIAGNOSIS — I10 Essential (primary) hypertension: Secondary | ICD-10-CM

## 2018-10-20 ENCOUNTER — Other Ambulatory Visit: Payer: Self-pay | Admitting: Family

## 2018-10-20 DIAGNOSIS — I10 Essential (primary) hypertension: Secondary | ICD-10-CM

## 2018-10-21 NOTE — Telephone Encounter (Signed)
Is he a patient here? His address is listed as [address]. He has not been seen since May 2019.  He will need to establish with a new PCP at another office as we have very limited access here to take on [facility]'s patients at this time.

## 2018-10-25 NOTE — Telephone Encounter (Signed)
Dr Jenny Reichmann,  Would you be willing to see this patient as a transfer patient? See message below.

## 2018-10-25 NOTE — Telephone Encounter (Signed)
Spoke with patient. He states he is still a patient here but during [place] we were not allowing patients to come inside so he didn't come back in May. He still wants to remain a patient here as he states he has been coming here forever and doesn't really want to start over for primary care with another location. He said he travels to [place] frequently because he has family here so he didn't mind the drive since he isn't seen here that often. I offered him info for [facility] and [facility] but he is adamant about staying here as he started out with Dr. Veverly Fells back in the day? Regarding his refills what did you want to do? Did you want to see him as a place holder, not see him at all or see if another provider might be willing to keep him here?

## 2018-10-25 NOTE — Telephone Encounter (Signed)
Ok with me 

## 2018-10-25 NOTE — Telephone Encounter (Signed)
Benjamin Wilkerson, will you see if Benjamin Wilkerson will take on this patient? I think he wants male provider and wants to stay at [facility]. Only comes here once per year as he lives closer to [place].  He will need OV in the next month or so as he was last seen here in May 2019.

## 2018-10-26 NOTE — Telephone Encounter (Signed)
Spoke to patient   Appointment has been scheduled

## 2018-11-08 ENCOUNTER — Encounter: Payer: Self-pay | Admitting: Internal Medicine

## 2018-11-08 ENCOUNTER — Other Ambulatory Visit (INDEPENDENT_AMBULATORY_CARE_PROVIDER_SITE_OTHER): Payer: Medicare Other

## 2018-11-08 ENCOUNTER — Other Ambulatory Visit: Payer: Self-pay

## 2018-11-08 ENCOUNTER — Other Ambulatory Visit: Payer: Self-pay | Admitting: Internal Medicine

## 2018-11-08 ENCOUNTER — Ambulatory Visit (INDEPENDENT_AMBULATORY_CARE_PROVIDER_SITE_OTHER): Payer: Medicare Other | Admitting: Internal Medicine

## 2018-11-08 VITALS — BP 140/92 | HR 71 | Temp 98.7°F | Ht 70.0 in | Wt 243.0 lb

## 2018-11-08 DIAGNOSIS — E538 Deficiency of other specified B group vitamins: Secondary | ICD-10-CM | POA: Diagnosis not present

## 2018-11-08 DIAGNOSIS — R739 Hyperglycemia, unspecified: Secondary | ICD-10-CM

## 2018-11-08 DIAGNOSIS — E785 Hyperlipidemia, unspecified: Secondary | ICD-10-CM | POA: Diagnosis not present

## 2018-11-08 DIAGNOSIS — N320 Bladder-neck obstruction: Secondary | ICD-10-CM | POA: Diagnosis not present

## 2018-11-08 DIAGNOSIS — N529 Male erectile dysfunction, unspecified: Secondary | ICD-10-CM

## 2018-11-08 DIAGNOSIS — E611 Iron deficiency: Secondary | ICD-10-CM | POA: Diagnosis not present

## 2018-11-08 DIAGNOSIS — I10 Essential (primary) hypertension: Secondary | ICD-10-CM | POA: Diagnosis not present

## 2018-11-08 DIAGNOSIS — E559 Vitamin D deficiency, unspecified: Secondary | ICD-10-CM | POA: Diagnosis not present

## 2018-11-08 DIAGNOSIS — R972 Elevated prostate specific antigen [PSA]: Secondary | ICD-10-CM

## 2018-11-08 LAB — URINALYSIS, ROUTINE W REFLEX MICROSCOPIC
Bilirubin Urine: NEGATIVE
Hgb urine dipstick: NEGATIVE
Ketones, ur: NEGATIVE
Leukocytes,Ua: NEGATIVE
Nitrite: NEGATIVE
RBC / HPF: NONE SEEN (ref 0–?)
Specific Gravity, Urine: 1.02 (ref 1.000–1.030)
Total Protein, Urine: NEGATIVE
Urine Glucose: NEGATIVE
Urobilinogen, UA: 1 (ref 0.0–1.0)
pH: 6.5 (ref 5.0–8.0)

## 2018-11-08 LAB — HEMOGLOBIN A1C: Hgb A1c MFr Bld: 5.9 % (ref 4.6–6.5)

## 2018-11-08 LAB — HEPATIC FUNCTION PANEL
ALT: 26 U/L (ref 0–53)
AST: 14 U/L (ref 0–37)
Albumin: 4.6 g/dL (ref 3.5–5.2)
Alkaline Phosphatase: 78 U/L (ref 39–117)
Bilirubin, Direct: 0.1 mg/dL (ref 0.0–0.3)
Total Bilirubin: 0.6 mg/dL (ref 0.2–1.2)
Total Protein: 7 g/dL (ref 6.0–8.3)

## 2018-11-08 LAB — CBC WITH DIFFERENTIAL/PLATELET
Basophils Absolute: 0 10*3/uL (ref 0.0–0.1)
Basophils Relative: 0.3 % (ref 0.0–3.0)
Eosinophils Absolute: 0.2 10*3/uL (ref 0.0–0.7)
Eosinophils Relative: 2.3 % (ref 0.0–5.0)
HCT: 46.7 % (ref 39.0–52.0)
Hemoglobin: 15.9 g/dL (ref 13.0–17.0)
Lymphocytes Relative: 19.4 % (ref 12.0–46.0)
Lymphs Abs: 1.8 10*3/uL (ref 0.7–4.0)
MCHC: 34.1 g/dL (ref 30.0–36.0)
MCV: 96.5 fl (ref 78.0–100.0)
Monocytes Absolute: 0.7 10*3/uL (ref 0.1–1.0)
Monocytes Relative: 8.3 % (ref 3.0–12.0)
Neutro Abs: 6.3 10*3/uL (ref 1.4–7.7)
Neutrophils Relative %: 69.7 % (ref 43.0–77.0)
Platelets: 215 10*3/uL (ref 150.0–400.0)
RBC: 4.84 Mil/uL (ref 4.22–5.81)
RDW: 12.4 % (ref 11.5–15.5)
WBC: 9 10*3/uL (ref 4.0–10.5)

## 2018-11-08 LAB — BASIC METABOLIC PANEL
BUN: 17 mg/dL (ref 6–23)
CO2: 27 mEq/L (ref 19–32)
Calcium: 9.2 mg/dL (ref 8.4–10.5)
Chloride: 104 mEq/L (ref 96–112)
Creatinine, Ser: 1.25 mg/dL (ref 0.40–1.50)
GFR: 56.89 mL/min — ABNORMAL LOW (ref >60.00)
Glucose, Bld: 85 mg/dL (ref 70–99)
Potassium: 4.1 mEq/L (ref 3.5–5.1)
Sodium: 138 mEq/L (ref 135–145)

## 2018-11-08 LAB — IBC PANEL
Iron: 85 ug/dL (ref 42–165)
Saturation Ratios: 26.2 % (ref 20.0–50.0)
Transferrin: 232 mg/dL (ref 212.0–360.0)

## 2018-11-08 LAB — VITAMIN B12: Vitamin B-12: 334 pg/mL (ref 211–911)

## 2018-11-08 LAB — LIPID PANEL
Cholesterol: 194 mg/dL (ref 0–200)
HDL: 38.6 mg/dL — ABNORMAL LOW (ref >39.00)
LDL Cholesterol: 119 mg/dL — ABNORMAL HIGH (ref 0–99)
NonHDL: 155.64
Total CHOL/HDL Ratio: 5
Triglycerides: 185 mg/dL — ABNORMAL HIGH (ref 0.0–149.0)
VLDL: 37 mg/dL (ref 0.0–40.0)

## 2018-11-08 LAB — TSH: TSH: 2.62 u[IU]/mL (ref 0.35–4.50)

## 2018-11-08 LAB — PSA: PSA: 9.07 ng/mL — ABNORMAL HIGH (ref 0.10–4.00)

## 2018-11-08 LAB — VITAMIN D 25 HYDROXY (VIT D DEFICIENCY, FRACTURES): VITD: 18.12 ng/mL — ABNORMAL LOW (ref 30.00–100.00)

## 2018-11-08 MED ORDER — SILDENAFIL CITRATE 100 MG PO TABS: 50.0000 mg | ORAL_TABLET | Freq: Every day | ORAL | 11 refills | Status: DC | PRN

## 2018-11-08 MED ORDER — METOPROLOL SUCCINATE ER 50 MG PO TB24: ORAL_TABLET | ORAL | 3 refills | Status: DC

## 2018-11-08 MED ORDER — VITAMIN D (ERGOCALCIFEROL) 1.25 MG (50000 UNIT) PO CAPS: 50000.0000 [IU] | ORAL_CAPSULE | ORAL | 0 refills | Status: DC

## 2018-11-08 MED ORDER — ATORVASTATIN CALCIUM 20 MG PO TABS: 20.0000 mg | ORAL_TABLET | Freq: Every day | ORAL | 3 refills | Status: DC

## 2018-11-08 NOTE — Progress Notes (Signed)
Subjective:    Patient ID: Benjamin Wilkerson, male    DOB: August 09, 1945, 73 y.o.   MRN: [id number]  HPI  Here to f/u; overall doing ok,  Pt denies chest pain, increasing sob or doe, wheezing, orthopnea, PND, increased LE swelling, palpitations, dizziness or syncope.  Pt denies new neurological symptoms such as new headache, or facial or extremity weakness or numbness.  Pt denies polydipsia, polyuria, or low sugar episode.  Pt states overall good compliance with meds, mostly trying to follow appropriate diet, with wt overall stable,  but little exercise however BP at home < 140/90  No new complaints except has ongoing ED symptoms with inability to maintain. Past Medical History:  Diagnosis Date  . Hyperlipidemia   . Hypertension    Past Surgical History:  Procedure Laterality Date  . FRACTURE SURGERY     left wrist and arm    reports that he has never smoked. He has never used smokeless tobacco. He reports current alcohol use of about 7.0 - 14.0 standard drinks of alcohol per week. He reports that he does not use drugs. family history includes Alcohol abuse in his father; Diabetes in his mother; Heart disease in his father and mother. No Known Allergies Current Outpatient Medications on File Prior to Visit  Medication Sig Dispense Refill  . aspirin EC 81 MG tablet Take 81 mg by mouth daily.     No current facility-administered medications on file prior to visit.    Review of Systems  Constitutional: Negative for other unusual diaphoresis or sweats HENT: Negative for ear discharge or swelling Eyes: Negative for other worsening visual disturbances Respiratory: Negative for stridor or other swelling  Gastrointestinal: Negative for worsening distension or other blood Genitourinary: Negative for retention or other urinary change Musculoskeletal: Negative for other MSK pain or swelling Skin: Negative for color change or other new lesions Neurological: Negative for worsening tremors and other  numbness  Psychiatric/Behavioral: Negative for worsening agitation or other fatigue All otherwise neg per pt     Objective:   Physical Exam BP (!) 140/92 (BP Location: Left Arm, Patient Position: Sitting, Cuff Size: Large)   Pulse 71   Temp 98.7 F (37.1 C) (Oral)   Ht 5\' 10"  (1.778 m)   Wt 243 lb (110.2 kg)   SpO2 95%   BMI 34.87 kg/m  VS noted,  Constitutional: Pt appears in NAD HENT: Head: NCAT.  Right Ear: External ear normal.  Left Ear: External ear normal.  Eyes: . Pupils are equal, round, and reactive to light. Conjunctivae and EOM are normal Nose: without d/c or deformity Neck: Neck supple. Gross normal ROM Cardiovascular: Normal rate and regular rhythm.   Pulmonary/Chest: Effort normal and breath sounds without rales or wheezing.  Abd:  Soft, NT, ND, + BS, no organomegaly Neurological: Pt is alert. At baseline orientation, motor grossly intact Skin: Skin is warm. No rashes, other new lesions, no LE edema Psychiatric: Pt behavior is normal without agitation  No other exam findings Lab Results  Component Value Date   WBC 9.0 11/08/2018   HGB 15.9 11/08/2018   HCT 46.7 11/08/2018   PLT 215.0 11/08/2018   GLUCOSE 85 11/08/2018   CHOL 194 11/08/2018   TRIG 185.0 (H) 11/08/2018   HDL 38.60 (L) 11/08/2018   LDLDIRECT 187.8 02/04/2009   LDLCALC 119 (H) 11/08/2018   ALT 26 11/08/2018   AST 14 11/08/2018   NA 138 11/08/2018   K 4.1 11/08/2018   CL 104 11/08/2018  CREATININE 1.25 11/08/2018   BUN 17 11/08/2018   CO2 27 11/08/2018   TSH 2.62 11/08/2018   PSA 9.07 (H) 11/08/2018   HGBA1C 5.9 11/08/2018        Assessment & Plan:

## 2018-11-08 NOTE — Assessment & Plan Note (Signed)
stable overall by history and exam, recent data reviewed with pt, and pt to continue medical treatment as before,  to f/u any worsening symptoms or concerns  

## 2018-11-08 NOTE — Patient Instructions (Signed)

## 2018-12-01 DIAGNOSIS — R972 Elevated prostate specific antigen [PSA]: Secondary | ICD-10-CM | POA: Diagnosis not present

## 2019-01-10 DIAGNOSIS — R972 Elevated prostate specific antigen [PSA]: Secondary | ICD-10-CM | POA: Diagnosis not present

## 2019-01-10 DIAGNOSIS — C61 Malignant neoplasm of prostate: Secondary | ICD-10-CM | POA: Diagnosis not present

## 2019-01-25 ENCOUNTER — Other Ambulatory Visit (HOSPITAL_COMMUNITY): Payer: Self-pay | Admitting: Urology

## 2019-01-25 DIAGNOSIS — C61 Malignant neoplasm of prostate: Secondary | ICD-10-CM

## 2019-02-02 ENCOUNTER — Telehealth: Payer: Self-pay | Admitting: Internal Medicine

## 2019-02-02 NOTE — Telephone Encounter (Signed)
Recv'd records from [facility] forwarded 3 pages to Dr. Cathlean Cower 12/31/20fbg

## 2019-02-13 NOTE — Progress Notes (Signed)
GU Location of Tumor / Histology: prostatic adenocarcinoma  If Prostate Cancer, Gleason Score is (4 + 3) and PSA is (10.20) on 11/22/2018. Prostate volume: 60.89 g.  Benjamin Wilkerson reports he has been seen at [facility] for years by PAs who have since moved on. Reports recently he was assigned to Dr. Cathlean Cower. Reports Dr. Jenny Reichmann did a routine check on him so he could refill his BP meds. In addition, Dr. Jenny Reichmann check the patient's psa and noted it was high. Patient reports Dr. Jenny Reichmann then referred him to Dr. [name] for further evaluation. Patient denies ever being told before this year his PSA was elevated.   Biopsies of prostate (if applicable) revealed:  Past/Anticipated interventions by urology, if any: prostate biopsy, bone scan (scheduled for 02/17/2019), referral for radiation therapy and six months ADT  Past/Anticipated interventions by medical oncology, if any: no  Weight changes, if any: denies  Bowel/Bladder complaints, if any: IPSS 4. SHIM 1. Denies dysuria, hematuria, leakage or incontinence. Reports mild urinary frequency. Reports nocturia x 1. Denies any bowel complaints.   Nausea/Vomiting, if any: no  Pain issues, if any:  denies  SAFETY ISSUES:  Prior radiation? denies  Pacemaker/ICD? denies  Possible current pregnancy? no, male patient  Is the patient on methotrexate? no  Current Complaints / other details:  74 year old male. Divorced with 2 grown children.

## 2019-02-14 ENCOUNTER — Other Ambulatory Visit: Payer: Self-pay

## 2019-02-14 ENCOUNTER — Encounter: Payer: Self-pay | Admitting: Radiation Oncology

## 2019-02-14 ENCOUNTER — Ambulatory Visit
Admission: RE | Admit: 2019-02-14 | Discharge: 2019-02-14 | Disposition: A | Discharge status: Home / Self Care | Payer: Medicare Other | Source: Ambulatory Visit | Attending: Radiation Oncology | Admitting: Radiation Oncology

## 2019-02-14 VITALS — Ht 70.0 in | Wt 230.0 lb

## 2019-02-14 DIAGNOSIS — R972 Elevated prostate specific antigen [PSA]: Secondary | ICD-10-CM | POA: Diagnosis not present

## 2019-02-14 DIAGNOSIS — C61 Malignant neoplasm of prostate: Secondary | ICD-10-CM

## 2019-02-14 DIAGNOSIS — N529 Male erectile dysfunction, unspecified: Secondary | ICD-10-CM | POA: Diagnosis not present

## 2019-02-14 HISTORY — DX: Malignant neoplasm of prostate: C61

## 2019-02-14 NOTE — Progress Notes (Signed)
See progress note under physician encounter. 

## 2019-02-14 NOTE — Progress Notes (Signed)
Radiation Oncology         [phone] ________________________________  Initial outpatient Consultation - Conducted via Telephone due to current COVID-19 concerns for limiting patient exposure  Name: Benjamin Wilkerson MRN: [id number]  Date: 02/14/2019  DOB: [birth year]  [id number], Hunt Oris, Wilkerson  Benjamin Fries, Wilkerson   REFERRING PHYSICIAN: [name], Wilkerson  DIAGNOSIS: 74 y.o. gentleman with Stage T1c adenocarcinoma of the prostate with Gleason score of 4+3, and PSA of 10.2.    ICD-10-CM   1. Malignant neoplasm of prostate (Benjamin Wilkerson)  C61     HISTORY OF PRESENT ILLNESS: Benjamin Wilkerson is a 74 y.o. male with a diagnosis of prostate cancer. He was noted to have an elevated PSA of 9.07 by his primary care physician, Dr. Jenny Reichmann.  Accordingly, he was referred for evaluation in urology by Dr. [name] on 12/01/2018,  digital rectal examination was performed at that time revealing a larger left lobe but no discrete nodularity.  A repeat PSA performed on 12/02/18 remained elevated at 10.2.  The patient proceeded to transrectal ultrasound with 12 biopsies of the prostate on 01/10/2019.  The prostate volume measured 60.89 cc.  Out of 12 core biopsies, 9 were positive.  The maximum Gleason score was 4+3, and this was seen in the left mid lateral and left mid. Additionally, Gleason 3+4 was seen in the left base lateral, left base, left apex (with perineural invasion), and right apex (small focus) and Gleason 3+3 was seen in left apex lateral, right mid lateral (small focus), and right apex lateral (small focus).  He is scheduled for disease staging imaging with bone scan and CT A/P on 02/17/2019.  The patient reviewed the biopsy results with his urologist and he has kindly been referred today for discussion of potential radiation treatment options.   PREVIOUS RADIATION THERAPY: No  PAST MEDICAL HISTORY:  Past Medical History:  Diagnosis Date  . Hyperlipidemia   . Hypertension   . Prostate cancer ([place])         PAST SURGICAL HISTORY: Past Surgical History:  Procedure Laterality Date  . FRACTURE SURGERY     left wrist and arm    FAMILY HISTORY:  Family History  Problem Relation Age of Onset  . Heart disease Mother        CAD/MI  . Diabetes Mother   . Heart disease Father        CAD/MI  . Alcohol abuse Father   . Cancer Neg Hx   . COPD Neg Hx   . Breast cancer Neg Hx   . Colon cancer Neg Hx   . Pancreatic cancer Neg Hx   . Prostate cancer Neg Hx     SOCIAL HISTORY:  Social History   Socioeconomic History  . Marital status: Divorced    Spouse name: Not on file  . Number of children: 2  . Years of education: 64  . Highest education level: Not on file  Occupational History  . Occupation: Geneticist, molecular    Comment: retired  Tobacco Use  . Smoking status: Never Smoker  . Smokeless tobacco: Never Used  Substance and Sexual Activity  . Alcohol use: Yes    Alcohol/week: 7.0 - 14.0 standard drinks    Types: 7 - 14 Standard drinks or equivalent per week  . Drug use: No  . Sexual activity: Yes    Partners: Female  Other Topics Concern  . Not on file  Social History Narrative   HSG, COLLEGE. MARRIED  3 YEARS, DIVORCED,  MARRIED 7 YEARS - DIVORCED. LONG TERM MONOGAMOUS RELATIONSHIP. 2 DAUGHTERS  1970 ; 1979.  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    Social Determinants of Health   Financial Resource Strain:   . Difficulty of Paying Living Expenses: Not on file  Food Insecurity:   . Worried About Charity fundraiser in the Last Year: Not on file  . Ran Out of Food in the Last Year: Not on file  Transportation Needs:   . Lack of Transportation (Medical): Not on file  . Lack of Transportation (Non-Medical): Not on file  Physical Activity:   . Days of Exercise per Week: Not on file  . Minutes of Exercise per Session: Not on file  Stress:   . Feeling of Stress : Not on file  Social Connections:   . Frequency of  Communication with Friends and Family: Not on file  . Frequency of Social Gatherings with Friends and Family: Not on file  . Attends Religious Services: Not on file  . Active Member of Clubs or Organizations: Not on file  . Attends Archivist Meetings: Not on file  . Marital Status: Not on file  Intimate Partner Violence:   . Fear of Current or Ex-Partner: Not on file  . Emotionally Abused: Not on file  . Physically Abused: Not on file  . Sexually Abused: Not on file    ALLERGIES: Patient has no known allergies.  MEDICATIONS:  Current Outpatient Medications  Medication Sig Dispense Refill  . atorvastatin (LIPITOR) 20 MG tablet Take 1 tablet (20 mg total) by mouth daily. 90 tablet 3  . metoprolol succinate (TOPROL-XL) 50 MG 24 hr tablet Take with or immediately following a meal. 90 tablet 3  . sildenafil (VIAGRA) 100 MG tablet Take 0.5-1 tablets (50-100 mg total) by mouth daily as needed for erectile dysfunction. (Patient not taking: Reported on 02/14/2019) 10 tablet 11   No current facility-administered medications for this encounter.    REVIEW OF SYSTEMS:  On review of systems, the patient reports that he is doing well overall. He denies any chest pain, shortness of breath, cough, fevers, chills, night sweats, unintended weight changes. He denies any bowel disturbances, and denies abdominal pain, nausea or vomiting. He denies any new musculoskeletal or joint aches or pains. His IPSS was 4, indicating mild urinary symptoms. He reports mild urinary frequency and nocturia x1. His SHIM was 1, indicating he has severe erectile dysfunction. A complete review of systems is obtained and is otherwise negative.    PHYSICAL EXAM:  Wt Readings from Last 3 Encounters:  02/14/19 230 lb (104.3 kg)  11/08/18 243 lb (110.2 kg)  06/16/17 254 lb (115.2 kg)   Temp Readings from Last 3 Encounters:  11/08/18 98.7 F (37.1 C) (Oral)  06/16/17 98.6 F (37 C) (Oral)  12/17/15 98.3 F (36.8  C) (Oral)   BP Readings from Last 3 Encounters:  11/08/18 (!) 140/92  06/16/17 132/80  12/17/15 (!) 148/88   Pulse Readings from Last 3 Encounters:  11/08/18 71  06/16/17 86  12/17/15 76   Pain Assessment Pain Score: 0-No pain/10  Physical exam not performed in light of telephone consult visit format.  KPS = 90  100 - Normal; no complaints; no evidence of disease. 90   - Able to carry on normal activity; minor signs or symptoms of disease. 80   - Normal activity with effort; some signs or symptoms of disease. 70   -  Cares for self; unable to carry on normal activity or to do active work. 60   - Requires occasional assistance, but is able to care for most of his personal needs. 50   - Requires considerable assistance and frequent medical care. 42   - Disabled; requires special care and assistance. 54   - Severely disabled; hospital admission is indicated although death not imminent. 83   - Very sick; hospital admission necessary; active supportive treatment necessary. 10   - Moribund; fatal processes progressing rapidly. 0     - Dead  Karnofsky DA, Abelmann Casey, Craver LS and Burchenal [facility] [phone]) The use of the nitrogen mustards in the palliative treatment of carcinoma: with particular reference to bronchogenic carcinoma Cancer 1 634-56  LABORATORY DATA:  Lab Results  Component Value Date   WBC 9.0 11/08/2018   HGB 15.9 11/08/2018   HCT 46.7 11/08/2018   MCV 96.5 11/08/2018   PLT 215.0 11/08/2018   Lab Results  Component Value Date   NA 138 11/08/2018   K 4.1 11/08/2018   CL 104 11/08/2018   CO2 27 11/08/2018   Lab Results  Component Value Date   ALT 26 11/08/2018   AST 14 11/08/2018   ALKPHOS 78 11/08/2018   BILITOT 0.6 11/08/2018     RADIOGRAPHY: NM Bone Scan Whole Body  Result Date: 02/17/2019 CLINICAL DATA:  Prostate cancer. EXAM: NUCLEAR MEDICINE WHOLE BODY BONE SCAN TECHNIQUE: Whole body anterior and posterior images were obtained approximately 3 hours  after intravenous injection of radiopharmaceutical. RADIOPHARMACEUTICALS:  21.5 mCi Technetium-48m MDP IV COMPARISON:  CT 02/07/2019. FINDINGS: Bilateral renal function and excretion. No focal bony abnormality is identified. No evidence of metastatic disease. IMPRESSION: No evidence of metastatic disease. Electronically Signed   By: Marcello Moores  Register   On: 02/17/2019 16:31      IMPRESSION/PLAN: This visit was conducted via Telephone to spare the patient unnecessary potential exposure in the healthcare setting during the current COVID-19 pandemic. 1. 74 y.o. gentleman with Stage T1c adenocarcinoma of the prostate with Gleason Score of 4+3, and PSA of 10.2. We discussed the patient's workup and outlined the nature of prostate cancer in this setting. The patient's T stage, Gleason's score, and PSA put him into the unfavorable intermediate risk group. Accordingly, he is eligible for a variety of potential treatment options including brachytherapy, 5.5 weeks of external radiation or prostatectomy. We discussed the available radiation techniques, and focused on the details and logistics of delivery. We discussed and outlined the risks, benefits, short and long-term effects associated with radiotherapy and compared and contrasted these with prostatectomy. We discussed the role of SpaceOAR in reducing the rectal toxicity associated with radiotherapy. We also detailed the role of ADT in the treatment of unfavorable intermediate risk prostate cancer and outlined the associated side effects that could be expected with this therapy.  He was encouraged to ask questions that were answered to his stated satisfaction.  At the end of the conversation, the patient is interested in moving forward with disease staging imaging as scheduled on 02/17/19 and pending there are no unexpected findings to suggest metastatic disease, he prefers to proceed with 5.5 weeks of external beam therapy concurrent with ST-ADT. We will share our  discussion with Dr. [name] and once we have the results from his upcoming imaging, we will move forward coordinating a follow up visit at [facility] to start ADT, first available and will also make arrangements for fiducial markers and SpaceOAR gel placement in early March,  prior to CT [facility], in anticipation of beginning his daily radiation treatments in mid [place], approximately 2 months after the start of ADT.  He appears to have a good understanding of his diease and our recommendations for treatment which are of curative intent. He is comfortable and in agreement with the stated plan so we will move forward with treatment planning accordingly.   Given current concerns for patient exposure during the COVID-19 pandemic, this encounter was conducted via telephone. The patient was notified in advance and was offered a MyChart meeting to allow for face to face communication but unfortunately reported that he did not have the appropriate resources/technology to support such a visit and instead preferred to proceed with telephone consult. The patient has given verbal consent for this type of encounter. The time spent during this encounter was 60 minutes. The attendants for this meeting include Tyler Pita Wilkerson, Ashlyn Bruning PA-C, [place], patient JAMES GUARDADO and his daughter Mechele Claude. During the encounter, Tyler Pita Wilkerson, Ashlyn Bruning PA-C, and scribe, Wilburn Mylar were located at [place].  Patient SURAFEL BULKLEY and daughter Mechele Claude were located at home.    Nicholos Johns, PA-C    Tyler Pita, Wilkerson  [facility] Direct Dial: [phone]  Fax: [phone] [email or web address]  Skype  [email or web address]  This document serves as a record of services personally performed by Tyler Pita, Wilkerson and Freeman Caldron, PA-C. It was created on their behalf by Wilburn Mylar, a trained medical scribe. The  creation of this record is based on the scribe's personal observations and the provider's statements to them. This document has been checked and approved by the attending provider.

## 2019-02-17 ENCOUNTER — Encounter (HOSPITAL_COMMUNITY)
Admission: RE | Admit: 2019-02-17 | Discharge: 2019-02-17 | Disposition: A | Discharge status: Home / Self Care | Payer: Medicare Other | Source: Ambulatory Visit | Attending: Urology | Admitting: Urology

## 2019-02-17 ENCOUNTER — Other Ambulatory Visit: Payer: Self-pay

## 2019-02-17 DIAGNOSIS — C61 Malignant neoplasm of prostate: Secondary | ICD-10-CM | POA: Diagnosis not present

## 2019-02-17 MED ORDER — TECHNETIUM TC 99M MEDRONATE IV KIT
21.5000 | PACK | Freq: Once | INTRAVENOUS | Status: AC
  Administered 2019-02-17: 21.5 via INTRAVENOUS

## 2019-02-20 ENCOUNTER — Telehealth: Payer: Self-pay | Admitting: *Deleted

## 2019-02-20 NOTE — Telephone Encounter (Signed)
CALLED PATIENT TO INFORM OF ADT APPT. ON 02/28/19 - ARRIVAL TIME- 3 PM @ DR. MARY ELLEN PACE'S OFFICE @ [facility], SPOKE WITH PATIENT AND HE IS AWARE OF THIS APPT.

## 2019-02-21 ENCOUNTER — Telehealth: Payer: Self-pay | Admitting: Medical Oncology

## 2019-02-21 NOTE — Telephone Encounter (Signed)
Attempted to reach patient to follow up post consult with Dr. Tammi Klippel, 1/12. There was no answer and his mailbox has not been set up. I will try again later.

## 2019-02-24 ENCOUNTER — Other Ambulatory Visit: Payer: Self-pay | Admitting: Urology

## 2019-02-24 ENCOUNTER — Encounter: Payer: Self-pay | Admitting: Urology

## 2019-02-24 DIAGNOSIS — C61 Malignant neoplasm of prostate: Secondary | ICD-10-CM

## 2019-02-24 NOTE — Progress Notes (Signed)
Patient completed his disease staging for newly diagnosed prostate cancer with CT and Bone scan which were without evidence of metastatic disease.  He is scheduled for ADT with Dr. [name] on 02/28/19 and will have fiducial markers and SpaceOAR gel on 04/04/19 with a CT SIM to follow on 04/25/19 in anticipation of proceeding with daily radiation shortly therafter.  Nicholos Johns, MMS, PA-C [place] [facility] [place]: [phone]  Fax: [phone]

## 2019-02-28 DIAGNOSIS — C61 Malignant neoplasm of prostate: Secondary | ICD-10-CM | POA: Diagnosis not present

## 2019-03-02 ENCOUNTER — Encounter: Payer: Self-pay | Admitting: Medical Oncology

## 2019-04-04 ENCOUNTER — Telehealth: Payer: Self-pay | Admitting: *Deleted

## 2019-04-04 DIAGNOSIS — C61 Malignant neoplasm of prostate: Secondary | ICD-10-CM | POA: Diagnosis not present

## 2019-04-04 NOTE — Telephone Encounter (Signed)
CALLED PATIENT TO INFORM OF MRI FOR 04-25-19, ARRIVAL TIME- 11:30 AM @ [facility], NO RESTRICTIONS TO TEST, SPOKE WITH PATIENT AND HE IS AWARE OF THIS TEST

## 2019-04-04 NOTE — Telephone Encounter (Signed)
RETURNED PATIENT'S PHONE CALL, UNABLE TO LEAVE MESSAGE DUE TO VM NOT BE CONNECTED, WILL CALL LATER

## 2019-04-09 ENCOUNTER — Ambulatory Visit: Payer: Medicare Other | Attending: Internal Medicine

## 2019-04-09 DIAGNOSIS — Z23 Encounter for immunization: Secondary | ICD-10-CM | POA: Insufficient documentation

## 2019-04-09 NOTE — Progress Notes (Signed)
   Covid-19 Vaccination Clinic  Name:  Benjamin Wilkerson    MRN: [id number] DOB: 03-Jul-1945  04/09/2019  Mr. Benjamin Wilkerson was observed post Covid-19 immunization for 15 minutes without incident. He was provided with Vaccine Information Sheet and instruction to access the V-Safe system.   Mr. Benjamin Wilkerson was instructed to call 911 with any severe reactions post vaccine: Marland Kitchen Difficulty breathing  . Swelling of face and throat  . A fast heartbeat  . A bad rash all over body  . Dizziness and weakness   Immunizations Administered    Name Date Dose VIS Date Route   Pfizer COVID-19 Vaccine 04/09/2019  9:37 AM 0.3 mL 01/13/2019 Intramuscular   Manufacturer: [place]   Lot: [id number]   [place]: [id number]

## 2019-04-12 DIAGNOSIS — D485 Neoplasm of uncertain behavior of skin: Secondary | ICD-10-CM | POA: Diagnosis not present

## 2019-04-12 DIAGNOSIS — L820 Inflamed seborrheic keratosis: Secondary | ICD-10-CM | POA: Diagnosis not present

## 2019-04-24 ENCOUNTER — Telehealth: Payer: Self-pay | Admitting: *Deleted

## 2019-04-24 NOTE — Telephone Encounter (Signed)
CALLED PATIENT TO REMIND OF SIM APPT. AND MRI FOR 04-25-19, SPOKE WITH PATIENT AND HE IS AWARE

## 2019-04-25 ENCOUNTER — Ambulatory Visit (HOSPITAL_COMMUNITY)
Admission: RE | Admit: 2019-04-25 | Discharge: 2019-04-25 | Disposition: A | Discharge status: Home / Self Care | Payer: Medicare Other | Source: Ambulatory Visit | Attending: Urology | Admitting: Urology

## 2019-04-25 ENCOUNTER — Other Ambulatory Visit: Payer: Self-pay

## 2019-04-25 ENCOUNTER — Encounter: Payer: Self-pay | Admitting: Medical Oncology

## 2019-04-25 ENCOUNTER — Ambulatory Visit
Admission: RE | Admit: 2019-04-25 | Discharge: 2019-04-25 | Disposition: A | Discharge status: Home / Self Care | Payer: Medicare Other | Source: Ambulatory Visit | Attending: Radiation Oncology | Admitting: Radiation Oncology

## 2019-04-25 DIAGNOSIS — C61 Malignant neoplasm of prostate: Secondary | ICD-10-CM | POA: Insufficient documentation

## 2019-04-25 DIAGNOSIS — Z510 Encounter for antineoplastic radiation therapy: Secondary | ICD-10-CM | POA: Insufficient documentation

## 2019-04-25 NOTE — Progress Notes (Signed)
Met patient and his daughter, Almyra Free at [place] appointment. I had attempted to reach patient but he does not have voicemail. I introduced myself as the prostate nurse navigator and discussed my role. I gave them my business card and asked them to call me with questions or concerns. We discussed CT sim, radiation and common side effects. He voiced understanding of the above and was very appreciative of my visit.

## 2019-04-26 NOTE — Progress Notes (Signed)
  Radiation Oncology         [phone] ________________________________  Name: Benjamin Wilkerson MRN: [id number]  Date: 04/25/2019  DOB: August 09, 1945  SIMULATION AND TREATMENT PLANNING NOTE    ICD-10-CM   1. Malignant neoplasm of prostate ([place])  C61     DIAGNOSIS:  74 y.o. gentleman with Stage T1c adenocarcinoma of the prostate with Gleason Score of 4+3, and PSA of 10.2.  NARRATIVE:  The patient was brought to the [place].  Identity was confirmed.  All relevant records and images related to the planned course of therapy were reviewed.  The patient freely provided informed written consent to proceed with treatment after reviewing the details related to the planned course of therapy. The consent form was witnessed and verified by the simulation staff.  Then, the patient was set-up in a stable reproducible supine position for radiation therapy.  A vacuum lock pillow device was custom fabricated to position his legs in a reproducible immobilized position.  Then, I performed a urethrogram under sterile conditions to identify the prostatic apex.  CT images were obtained.  Surface markings were placed.  The CT images were loaded into the planning software.  Then the prostate target and avoidance structures including the rectum, bladder, bowel and hips were contoured.  Treatment planning then occurred.  The radiation prescription was entered and confirmed.  A total of one complex treatment devices was fabricated. I have requested : Intensity Modulated Radiotherapy (IMRT) is medically necessary for this case for the following reason:  Rectal sparing.Marland Kitchen  PLAN:  The patient will receive 70 Gy in 28 fractions.  ________________________________  Sheral Apley Tammi Klippel, M.D.

## 2019-05-02 DIAGNOSIS — C61 Malignant neoplasm of prostate: Secondary | ICD-10-CM | POA: Diagnosis not present

## 2019-05-02 DIAGNOSIS — Z510 Encounter for antineoplastic radiation therapy: Secondary | ICD-10-CM | POA: Diagnosis not present

## 2019-05-03 ENCOUNTER — Other Ambulatory Visit: Payer: Self-pay

## 2019-05-03 ENCOUNTER — Ambulatory Visit
Admission: RE | Admit: 2019-05-03 | Discharge: 2019-05-03 | Disposition: A | Discharge status: Home / Self Care | Payer: Medicare Other | Source: Ambulatory Visit | Attending: Radiation Oncology | Admitting: Radiation Oncology

## 2019-05-03 DIAGNOSIS — C61 Malignant neoplasm of prostate: Secondary | ICD-10-CM | POA: Diagnosis not present

## 2019-05-03 DIAGNOSIS — Z510 Encounter for antineoplastic radiation therapy: Secondary | ICD-10-CM | POA: Diagnosis not present

## 2019-05-04 ENCOUNTER — Ambulatory Visit
Admission: RE | Admit: 2019-05-04 | Discharge: 2019-05-04 | Disposition: A | Discharge status: Home / Self Care | Payer: Medicare Other | Source: Ambulatory Visit | Attending: Radiation Oncology | Admitting: Radiation Oncology

## 2019-05-04 ENCOUNTER — Other Ambulatory Visit: Payer: Self-pay

## 2019-05-04 DIAGNOSIS — R3915 Urgency of urination: Secondary | ICD-10-CM | POA: Insufficient documentation

## 2019-05-04 DIAGNOSIS — R350 Frequency of micturition: Secondary | ICD-10-CM | POA: Insufficient documentation

## 2019-05-04 DIAGNOSIS — Z510 Encounter for antineoplastic radiation therapy: Secondary | ICD-10-CM | POA: Diagnosis not present

## 2019-05-04 DIAGNOSIS — C61 Malignant neoplasm of prostate: Secondary | ICD-10-CM | POA: Insufficient documentation

## 2019-05-05 ENCOUNTER — Other Ambulatory Visit: Payer: Self-pay

## 2019-05-05 ENCOUNTER — Ambulatory Visit
Admission: RE | Admit: 2019-05-05 | Discharge: 2019-05-05 | Disposition: A | Discharge status: Home / Self Care | Payer: Medicare Other | Source: Ambulatory Visit | Attending: Radiation Oncology | Admitting: Radiation Oncology

## 2019-05-05 ENCOUNTER — Encounter: Payer: Self-pay | Admitting: Medical Oncology

## 2019-05-05 DIAGNOSIS — R3915 Urgency of urination: Secondary | ICD-10-CM | POA: Diagnosis not present

## 2019-05-05 DIAGNOSIS — Z510 Encounter for antineoplastic radiation therapy: Secondary | ICD-10-CM | POA: Diagnosis not present

## 2019-05-05 DIAGNOSIS — R350 Frequency of micturition: Secondary | ICD-10-CM | POA: Diagnosis not present

## 2019-05-05 DIAGNOSIS — C61 Malignant neoplasm of prostate: Secondary | ICD-10-CM | POA: Diagnosis not present

## 2019-05-08 ENCOUNTER — Other Ambulatory Visit: Payer: Self-pay

## 2019-05-08 ENCOUNTER — Ambulatory Visit
Admission: RE | Admit: 2019-05-08 | Discharge: 2019-05-08 | Disposition: A | Discharge status: Home / Self Care | Payer: Medicare Other | Source: Ambulatory Visit | Attending: Radiation Oncology | Admitting: Radiation Oncology

## 2019-05-08 DIAGNOSIS — Z510 Encounter for antineoplastic radiation therapy: Secondary | ICD-10-CM | POA: Diagnosis not present

## 2019-05-08 DIAGNOSIS — R3915 Urgency of urination: Secondary | ICD-10-CM | POA: Diagnosis not present

## 2019-05-08 DIAGNOSIS — R350 Frequency of micturition: Secondary | ICD-10-CM | POA: Diagnosis not present

## 2019-05-08 DIAGNOSIS — C61 Malignant neoplasm of prostate: Secondary | ICD-10-CM | POA: Diagnosis not present

## 2019-05-09 ENCOUNTER — Ambulatory Visit
Admission: RE | Admit: 2019-05-09 | Discharge: 2019-05-09 | Disposition: A | Discharge status: Home / Self Care | Payer: Medicare Other | Source: Ambulatory Visit | Attending: Radiation Oncology | Admitting: Radiation Oncology

## 2019-05-09 ENCOUNTER — Ambulatory Visit: Payer: Medicare Other | Attending: Internal Medicine

## 2019-05-09 ENCOUNTER — Other Ambulatory Visit: Payer: Self-pay

## 2019-05-09 DIAGNOSIS — R3915 Urgency of urination: Secondary | ICD-10-CM | POA: Diagnosis not present

## 2019-05-09 DIAGNOSIS — R350 Frequency of micturition: Secondary | ICD-10-CM | POA: Diagnosis not present

## 2019-05-09 DIAGNOSIS — Z510 Encounter for antineoplastic radiation therapy: Secondary | ICD-10-CM | POA: Diagnosis not present

## 2019-05-09 DIAGNOSIS — Z23 Encounter for immunization: Secondary | ICD-10-CM

## 2019-05-09 DIAGNOSIS — C61 Malignant neoplasm of prostate: Secondary | ICD-10-CM | POA: Diagnosis not present

## 2019-05-09 NOTE — Progress Notes (Signed)
   Covid-19 Vaccination Clinic  Name:  BOL MASIELLO    MRN: [id number] DOB: 1945/04/17  05/09/2019  Mr. Dague was observed post Covid-19 immunization for 15 minutes without incident. He was provided with Vaccine Information Sheet and instruction to access the V-Safe system.   Mr. Shvarts was instructed to call 911 with any severe reactions post vaccine: Marland Kitchen Difficulty breathing  . Swelling of face and throat  . A fast heartbeat  . A bad rash all over body  . Dizziness and weakness   Immunizations Administered    Name Date Dose VIS Date Route   Pfizer COVID-19 Vaccine 05/09/2019  1:16 PM 0.3 mL 01/13/2019 Intramuscular   Manufacturer: [place]   Lot: [id number]   [place]: [id number]

## 2019-05-10 ENCOUNTER — Ambulatory Visit
Admission: RE | Admit: 2019-05-10 | Discharge: 2019-05-10 | Disposition: A | Discharge status: Home / Self Care | Payer: Medicare Other | Source: Ambulatory Visit | Attending: Radiation Oncology | Admitting: Radiation Oncology

## 2019-05-10 DIAGNOSIS — R3915 Urgency of urination: Secondary | ICD-10-CM | POA: Diagnosis not present

## 2019-05-10 DIAGNOSIS — R350 Frequency of micturition: Secondary | ICD-10-CM | POA: Diagnosis not present

## 2019-05-10 DIAGNOSIS — Z510 Encounter for antineoplastic radiation therapy: Secondary | ICD-10-CM | POA: Diagnosis not present

## 2019-05-10 DIAGNOSIS — C61 Malignant neoplasm of prostate: Secondary | ICD-10-CM | POA: Diagnosis not present

## 2019-05-11 ENCOUNTER — Ambulatory Visit
Admission: RE | Admit: 2019-05-11 | Discharge: 2019-05-11 | Disposition: A | Discharge status: Home / Self Care | Payer: Medicare Other | Source: Ambulatory Visit | Attending: Radiation Oncology | Admitting: Radiation Oncology

## 2019-05-11 ENCOUNTER — Other Ambulatory Visit: Payer: Self-pay

## 2019-05-11 ENCOUNTER — Telehealth: Payer: Self-pay | Admitting: Radiation Oncology

## 2019-05-11 DIAGNOSIS — R3915 Urgency of urination: Secondary | ICD-10-CM | POA: Diagnosis not present

## 2019-05-11 DIAGNOSIS — R350 Frequency of micturition: Secondary | ICD-10-CM

## 2019-05-11 DIAGNOSIS — Z510 Encounter for antineoplastic radiation therapy: Secondary | ICD-10-CM | POA: Diagnosis not present

## 2019-05-11 DIAGNOSIS — C61 Malignant neoplasm of prostate: Secondary | ICD-10-CM | POA: Diagnosis not present

## 2019-05-11 LAB — URINALYSIS, COMPLETE (UACMP) WITH MICROSCOPIC
Bacteria, UA: NONE SEEN
Bilirubin Urine: NEGATIVE
Glucose, UA: 50 mg/dL — AB
Hgb urine dipstick: NEGATIVE
Ketones, ur: NEGATIVE mg/dL
Leukocytes,Ua: NEGATIVE
Nitrite: NEGATIVE
Protein, ur: NEGATIVE mg/dL
Specific Gravity, Urine: 1.014 (ref 1.005–1.030)
pH: 6 (ref 5.0–8.0)

## 2019-05-11 NOTE — Telephone Encounter (Signed)
Phoned patient. Explained that his urinalysis did not indicate a urinary tract infection but did reveal elevated levels of glucose. Patient denies ever being treated for diabetes but reports his mother had it. Explained that additional blood work is needed to determine why high levels of glucose are in his urine but this could be an indicator of diabetes or prediabetes. Explained his symptoms of polyuria are most likely related to the sugar in his urine not radiation therapy or a UTI. Patient understands this RN will communicate these findings to his PCP, Cathlean Cower. Explained he should expect a call from Dr. Gwynn Burly office with an appointment for evaluation. Encouraged patient to increase water intake to flush system and avoid sugary foods until he could be evaluated by Dr. Jenny Reichmann. Patient verbalized understanding of all reviewed.

## 2019-05-11 NOTE — Progress Notes (Signed)
Received patient in the clinic today following radiation therapy. To date patient has received 7 of 28 treatments for his prostate cancer.   Patient reports new onset urinary frequency, intense urgency with minimal output, nocturia x 4-5, the sensation of not emptying his bladder completely and discomfort with urination. Patient provided a urine sample. Patient discharged home in no distress. Patient understands this RN will phone him with directions once his urinalysis comes back.

## 2019-05-11 NOTE — Telephone Encounter (Signed)
Agree, ok to assist pt; I am leaving out of town for 1 wk, but ok to see another MD

## 2019-05-12 ENCOUNTER — Other Ambulatory Visit: Payer: Self-pay

## 2019-05-12 ENCOUNTER — Ambulatory Visit
Admission: RE | Admit: 2019-05-12 | Discharge: 2019-05-12 | Disposition: A | Discharge status: Home / Self Care | Payer: Medicare Other | Source: Ambulatory Visit | Attending: Radiation Oncology | Admitting: Radiation Oncology

## 2019-05-12 DIAGNOSIS — Z510 Encounter for antineoplastic radiation therapy: Secondary | ICD-10-CM | POA: Diagnosis not present

## 2019-05-12 DIAGNOSIS — R3915 Urgency of urination: Secondary | ICD-10-CM | POA: Diagnosis not present

## 2019-05-12 DIAGNOSIS — R350 Frequency of micturition: Secondary | ICD-10-CM | POA: Diagnosis not present

## 2019-05-12 DIAGNOSIS — C61 Malignant neoplasm of prostate: Secondary | ICD-10-CM | POA: Diagnosis not present

## 2019-05-12 LAB — URINE CULTURE: Culture: NO GROWTH

## 2019-05-12 NOTE — Progress Notes (Signed)
Please call patient with normal result.  Thanks. MM 

## 2019-05-13 NOTE — Telephone Encounter (Signed)
Can you call pt and schedule an appt per pt convenience (but not too far out) to eval for DM?

## 2019-05-15 ENCOUNTER — Ambulatory Visit
Admission: RE | Admit: 2019-05-15 | Discharge: 2019-05-15 | Disposition: A | Discharge status: Home / Self Care | Payer: Medicare Other | Source: Ambulatory Visit | Attending: Radiation Oncology | Admitting: Radiation Oncology

## 2019-05-15 ENCOUNTER — Other Ambulatory Visit: Payer: Self-pay

## 2019-05-15 ENCOUNTER — Telehealth: Payer: Self-pay | Admitting: Internal Medicine

## 2019-05-15 DIAGNOSIS — C61 Malignant neoplasm of prostate: Secondary | ICD-10-CM | POA: Diagnosis not present

## 2019-05-15 DIAGNOSIS — R350 Frequency of micturition: Secondary | ICD-10-CM | POA: Diagnosis not present

## 2019-05-15 DIAGNOSIS — R3915 Urgency of urination: Secondary | ICD-10-CM | POA: Diagnosis not present

## 2019-05-15 DIAGNOSIS — Z510 Encounter for antineoplastic radiation therapy: Secondary | ICD-10-CM | POA: Diagnosis not present

## 2019-05-15 NOTE — Telephone Encounter (Signed)
Called pt and was not able to leave a voicemail.

## 2019-05-15 NOTE — Telephone Encounter (Signed)
Patient states he had a missed call from our office. I do not see where anyone reached out to him. He state it could have something to do with labs he had done at the cancer center. He states they was going to send this information to [place].  If called patient, Please call and follow up with him. Thank you.

## 2019-05-16 ENCOUNTER — Ambulatory Visit
Admission: RE | Admit: 2019-05-16 | Discharge: 2019-05-16 | Disposition: A | Discharge status: Home / Self Care | Payer: Medicare Other | Source: Ambulatory Visit | Attending: Radiation Oncology | Admitting: Radiation Oncology

## 2019-05-16 ENCOUNTER — Other Ambulatory Visit: Payer: Self-pay

## 2019-05-16 DIAGNOSIS — C61 Malignant neoplasm of prostate: Secondary | ICD-10-CM | POA: Diagnosis not present

## 2019-05-16 DIAGNOSIS — Z510 Encounter for antineoplastic radiation therapy: Secondary | ICD-10-CM | POA: Diagnosis not present

## 2019-05-16 DIAGNOSIS — R3915 Urgency of urination: Secondary | ICD-10-CM | POA: Diagnosis not present

## 2019-05-16 DIAGNOSIS — R350 Frequency of micturition: Secondary | ICD-10-CM | POA: Diagnosis not present

## 2019-05-17 ENCOUNTER — Other Ambulatory Visit: Payer: Self-pay

## 2019-05-17 ENCOUNTER — Ambulatory Visit
Admission: RE | Admit: 2019-05-17 | Discharge: 2019-05-17 | Disposition: A | Discharge status: Home / Self Care | Payer: Medicare Other | Source: Ambulatory Visit | Attending: Radiation Oncology | Admitting: Radiation Oncology

## 2019-05-17 DIAGNOSIS — C61 Malignant neoplasm of prostate: Secondary | ICD-10-CM | POA: Diagnosis not present

## 2019-05-17 DIAGNOSIS — Z510 Encounter for antineoplastic radiation therapy: Secondary | ICD-10-CM | POA: Diagnosis not present

## 2019-05-17 DIAGNOSIS — R350 Frequency of micturition: Secondary | ICD-10-CM | POA: Diagnosis not present

## 2019-05-17 DIAGNOSIS — R3915 Urgency of urination: Secondary | ICD-10-CM | POA: Diagnosis not present

## 2019-05-18 ENCOUNTER — Ambulatory Visit
Admission: RE | Admit: 2019-05-18 | Discharge: 2019-05-18 | Disposition: A | Discharge status: Home / Self Care | Payer: Medicare Other | Source: Ambulatory Visit | Attending: Radiation Oncology | Admitting: Radiation Oncology

## 2019-05-18 ENCOUNTER — Other Ambulatory Visit: Payer: Self-pay

## 2019-05-18 DIAGNOSIS — Z510 Encounter for antineoplastic radiation therapy: Secondary | ICD-10-CM | POA: Diagnosis not present

## 2019-05-18 DIAGNOSIS — C61 Malignant neoplasm of prostate: Secondary | ICD-10-CM | POA: Diagnosis not present

## 2019-05-18 DIAGNOSIS — R350 Frequency of micturition: Secondary | ICD-10-CM | POA: Diagnosis not present

## 2019-05-18 DIAGNOSIS — R3915 Urgency of urination: Secondary | ICD-10-CM | POA: Diagnosis not present

## 2019-05-19 ENCOUNTER — Other Ambulatory Visit: Payer: Self-pay

## 2019-05-19 ENCOUNTER — Ambulatory Visit
Admission: RE | Admit: 2019-05-19 | Discharge: 2019-05-19 | Disposition: A | Discharge status: Home / Self Care | Payer: Medicare Other | Source: Ambulatory Visit | Attending: Radiation Oncology | Admitting: Radiation Oncology

## 2019-05-19 DIAGNOSIS — Z510 Encounter for antineoplastic radiation therapy: Secondary | ICD-10-CM | POA: Diagnosis not present

## 2019-05-19 DIAGNOSIS — R350 Frequency of micturition: Secondary | ICD-10-CM | POA: Diagnosis not present

## 2019-05-19 DIAGNOSIS — C61 Malignant neoplasm of prostate: Secondary | ICD-10-CM | POA: Diagnosis not present

## 2019-05-19 DIAGNOSIS — R3915 Urgency of urination: Secondary | ICD-10-CM | POA: Diagnosis not present

## 2019-05-22 ENCOUNTER — Ambulatory Visit
Admission: RE | Admit: 2019-05-22 | Discharge: 2019-05-22 | Disposition: A | Discharge status: Home / Self Care | Payer: Medicare Other | Source: Ambulatory Visit | Attending: Radiation Oncology | Admitting: Radiation Oncology

## 2019-05-22 ENCOUNTER — Other Ambulatory Visit: Payer: Self-pay

## 2019-05-22 DIAGNOSIS — R3915 Urgency of urination: Secondary | ICD-10-CM | POA: Diagnosis not present

## 2019-05-22 DIAGNOSIS — R350 Frequency of micturition: Secondary | ICD-10-CM | POA: Diagnosis not present

## 2019-05-22 DIAGNOSIS — C61 Malignant neoplasm of prostate: Secondary | ICD-10-CM | POA: Diagnosis not present

## 2019-05-22 DIAGNOSIS — Z510 Encounter for antineoplastic radiation therapy: Secondary | ICD-10-CM | POA: Diagnosis not present

## 2019-05-23 ENCOUNTER — Other Ambulatory Visit: Payer: Self-pay

## 2019-05-23 ENCOUNTER — Ambulatory Visit
Admission: RE | Admit: 2019-05-23 | Discharge: 2019-05-23 | Disposition: A | Discharge status: Home / Self Care | Payer: Medicare Other | Source: Ambulatory Visit | Attending: Radiation Oncology | Admitting: Radiation Oncology

## 2019-05-23 DIAGNOSIS — Z510 Encounter for antineoplastic radiation therapy: Secondary | ICD-10-CM | POA: Diagnosis not present

## 2019-05-23 DIAGNOSIS — R3915 Urgency of urination: Secondary | ICD-10-CM | POA: Diagnosis not present

## 2019-05-23 DIAGNOSIS — C61 Malignant neoplasm of prostate: Secondary | ICD-10-CM | POA: Diagnosis not present

## 2019-05-23 DIAGNOSIS — R350 Frequency of micturition: Secondary | ICD-10-CM | POA: Diagnosis not present

## 2019-05-24 ENCOUNTER — Ambulatory Visit
Admission: RE | Admit: 2019-05-24 | Discharge: 2019-05-24 | Disposition: A | Discharge status: Home / Self Care | Payer: Medicare Other | Source: Ambulatory Visit | Attending: Radiation Oncology | Admitting: Radiation Oncology

## 2019-05-24 ENCOUNTER — Other Ambulatory Visit: Payer: Self-pay

## 2019-05-24 DIAGNOSIS — R3915 Urgency of urination: Secondary | ICD-10-CM | POA: Diagnosis not present

## 2019-05-24 DIAGNOSIS — Z510 Encounter for antineoplastic radiation therapy: Secondary | ICD-10-CM | POA: Diagnosis not present

## 2019-05-24 DIAGNOSIS — R350 Frequency of micturition: Secondary | ICD-10-CM | POA: Diagnosis not present

## 2019-05-24 DIAGNOSIS — C61 Malignant neoplasm of prostate: Secondary | ICD-10-CM | POA: Diagnosis not present

## 2019-05-25 ENCOUNTER — Ambulatory Visit
Admission: RE | Admit: 2019-05-25 | Discharge: 2019-05-25 | Disposition: A | Discharge status: Home / Self Care | Payer: Medicare Other | Source: Ambulatory Visit | Attending: Radiation Oncology | Admitting: Radiation Oncology

## 2019-05-25 ENCOUNTER — Other Ambulatory Visit: Payer: Self-pay

## 2019-05-25 DIAGNOSIS — R350 Frequency of micturition: Secondary | ICD-10-CM | POA: Diagnosis not present

## 2019-05-25 DIAGNOSIS — C61 Malignant neoplasm of prostate: Secondary | ICD-10-CM | POA: Diagnosis not present

## 2019-05-25 DIAGNOSIS — R3915 Urgency of urination: Secondary | ICD-10-CM | POA: Diagnosis not present

## 2019-05-25 DIAGNOSIS — Z510 Encounter for antineoplastic radiation therapy: Secondary | ICD-10-CM | POA: Diagnosis not present

## 2019-05-26 ENCOUNTER — Other Ambulatory Visit: Payer: Self-pay

## 2019-05-26 ENCOUNTER — Ambulatory Visit
Admission: RE | Admit: 2019-05-26 | Discharge: 2019-05-26 | Disposition: A | Discharge status: Home / Self Care | Payer: Medicare Other | Source: Ambulatory Visit | Attending: Radiation Oncology | Admitting: Radiation Oncology

## 2019-05-26 DIAGNOSIS — C61 Malignant neoplasm of prostate: Secondary | ICD-10-CM | POA: Diagnosis not present

## 2019-05-26 DIAGNOSIS — R3915 Urgency of urination: Secondary | ICD-10-CM | POA: Diagnosis not present

## 2019-05-26 DIAGNOSIS — Z510 Encounter for antineoplastic radiation therapy: Secondary | ICD-10-CM | POA: Diagnosis not present

## 2019-05-26 DIAGNOSIS — R350 Frequency of micturition: Secondary | ICD-10-CM | POA: Diagnosis not present

## 2019-05-29 ENCOUNTER — Ambulatory Visit
Admission: RE | Admit: 2019-05-29 | Discharge: 2019-05-29 | Disposition: A | Discharge status: Home / Self Care | Payer: Medicare Other | Source: Ambulatory Visit | Attending: Radiation Oncology | Admitting: Radiation Oncology

## 2019-05-29 ENCOUNTER — Other Ambulatory Visit: Payer: Self-pay

## 2019-05-29 DIAGNOSIS — R3915 Urgency of urination: Secondary | ICD-10-CM | POA: Diagnosis not present

## 2019-05-29 DIAGNOSIS — Z510 Encounter for antineoplastic radiation therapy: Secondary | ICD-10-CM | POA: Diagnosis not present

## 2019-05-29 DIAGNOSIS — R350 Frequency of micturition: Secondary | ICD-10-CM | POA: Diagnosis not present

## 2019-05-29 DIAGNOSIS — C61 Malignant neoplasm of prostate: Secondary | ICD-10-CM | POA: Diagnosis not present

## 2019-05-30 ENCOUNTER — Ambulatory Visit
Admission: RE | Admit: 2019-05-30 | Discharge: 2019-05-30 | Disposition: A | Discharge status: Home / Self Care | Payer: Medicare Other | Source: Ambulatory Visit | Attending: Radiation Oncology | Admitting: Radiation Oncology

## 2019-05-30 ENCOUNTER — Other Ambulatory Visit: Payer: Self-pay

## 2019-05-30 DIAGNOSIS — R350 Frequency of micturition: Secondary | ICD-10-CM | POA: Diagnosis not present

## 2019-05-30 DIAGNOSIS — Z510 Encounter for antineoplastic radiation therapy: Secondary | ICD-10-CM | POA: Diagnosis not present

## 2019-05-30 DIAGNOSIS — R3915 Urgency of urination: Secondary | ICD-10-CM | POA: Diagnosis not present

## 2019-05-30 DIAGNOSIS — C61 Malignant neoplasm of prostate: Secondary | ICD-10-CM | POA: Diagnosis not present

## 2019-05-31 ENCOUNTER — Ambulatory Visit
Admission: RE | Admit: 2019-05-31 | Discharge: 2019-05-31 | Disposition: A | Discharge status: Home / Self Care | Payer: Medicare Other | Source: Ambulatory Visit | Attending: Radiation Oncology | Admitting: Radiation Oncology

## 2019-05-31 ENCOUNTER — Other Ambulatory Visit: Payer: Self-pay

## 2019-05-31 DIAGNOSIS — Z510 Encounter for antineoplastic radiation therapy: Secondary | ICD-10-CM | POA: Diagnosis not present

## 2019-05-31 DIAGNOSIS — C61 Malignant neoplasm of prostate: Secondary | ICD-10-CM | POA: Diagnosis not present

## 2019-05-31 DIAGNOSIS — R3915 Urgency of urination: Secondary | ICD-10-CM | POA: Diagnosis not present

## 2019-05-31 DIAGNOSIS — R350 Frequency of micturition: Secondary | ICD-10-CM | POA: Diagnosis not present

## 2019-06-01 ENCOUNTER — Other Ambulatory Visit: Payer: Self-pay

## 2019-06-01 ENCOUNTER — Ambulatory Visit
Admission: RE | Admit: 2019-06-01 | Discharge: 2019-06-01 | Disposition: A | Discharge status: Home / Self Care | Payer: Medicare Other | Source: Ambulatory Visit | Attending: Radiation Oncology | Admitting: Radiation Oncology

## 2019-06-01 DIAGNOSIS — Z510 Encounter for antineoplastic radiation therapy: Secondary | ICD-10-CM | POA: Diagnosis not present

## 2019-06-01 DIAGNOSIS — R350 Frequency of micturition: Secondary | ICD-10-CM | POA: Diagnosis not present

## 2019-06-01 DIAGNOSIS — R3915 Urgency of urination: Secondary | ICD-10-CM | POA: Diagnosis not present

## 2019-06-01 DIAGNOSIS — C61 Malignant neoplasm of prostate: Secondary | ICD-10-CM | POA: Diagnosis not present

## 2019-06-02 ENCOUNTER — Ambulatory Visit
Admission: RE | Admit: 2019-06-02 | Discharge: 2019-06-02 | Disposition: A | Discharge status: Home / Self Care | Payer: Medicare Other | Source: Ambulatory Visit | Attending: Radiation Oncology | Admitting: Radiation Oncology

## 2019-06-02 ENCOUNTER — Other Ambulatory Visit: Payer: Self-pay

## 2019-06-02 DIAGNOSIS — R3915 Urgency of urination: Secondary | ICD-10-CM | POA: Diagnosis not present

## 2019-06-02 DIAGNOSIS — C61 Malignant neoplasm of prostate: Secondary | ICD-10-CM | POA: Diagnosis not present

## 2019-06-02 DIAGNOSIS — R350 Frequency of micturition: Secondary | ICD-10-CM | POA: Diagnosis not present

## 2019-06-02 DIAGNOSIS — Z510 Encounter for antineoplastic radiation therapy: Secondary | ICD-10-CM | POA: Diagnosis not present

## 2019-06-05 ENCOUNTER — Other Ambulatory Visit: Payer: Self-pay

## 2019-06-05 ENCOUNTER — Ambulatory Visit
Admission: RE | Admit: 2019-06-05 | Discharge: 2019-06-05 | Disposition: A | Discharge status: Home / Self Care | Payer: Medicare Other | Source: Ambulatory Visit | Attending: Radiation Oncology | Admitting: Radiation Oncology

## 2019-06-05 DIAGNOSIS — R350 Frequency of micturition: Secondary | ICD-10-CM | POA: Diagnosis not present

## 2019-06-05 DIAGNOSIS — R3915 Urgency of urination: Secondary | ICD-10-CM | POA: Insufficient documentation

## 2019-06-05 DIAGNOSIS — Z510 Encounter for antineoplastic radiation therapy: Secondary | ICD-10-CM | POA: Insufficient documentation

## 2019-06-05 DIAGNOSIS — C61 Malignant neoplasm of prostate: Secondary | ICD-10-CM | POA: Diagnosis not present

## 2019-06-06 ENCOUNTER — Other Ambulatory Visit: Payer: Self-pay

## 2019-06-06 ENCOUNTER — Ambulatory Visit: Payer: Medicare Other

## 2019-06-06 ENCOUNTER — Ambulatory Visit
Admission: RE | Admit: 2019-06-06 | Discharge: 2019-06-06 | Disposition: A | Discharge status: Home / Self Care | Payer: Medicare Other | Source: Ambulatory Visit | Attending: Radiation Oncology | Admitting: Radiation Oncology

## 2019-06-06 DIAGNOSIS — R3915 Urgency of urination: Secondary | ICD-10-CM | POA: Diagnosis not present

## 2019-06-06 DIAGNOSIS — C61 Malignant neoplasm of prostate: Secondary | ICD-10-CM | POA: Diagnosis not present

## 2019-06-06 DIAGNOSIS — R350 Frequency of micturition: Secondary | ICD-10-CM | POA: Diagnosis not present

## 2019-06-06 DIAGNOSIS — Z510 Encounter for antineoplastic radiation therapy: Secondary | ICD-10-CM | POA: Diagnosis not present

## 2019-06-07 ENCOUNTER — Ambulatory Visit
Admission: RE | Admit: 2019-06-07 | Discharge: 2019-06-07 | Disposition: A | Discharge status: Home / Self Care | Payer: Medicare Other | Source: Ambulatory Visit | Attending: Radiation Oncology | Admitting: Radiation Oncology

## 2019-06-07 ENCOUNTER — Other Ambulatory Visit: Payer: Self-pay

## 2019-06-07 DIAGNOSIS — R350 Frequency of micturition: Secondary | ICD-10-CM | POA: Diagnosis not present

## 2019-06-07 DIAGNOSIS — Z510 Encounter for antineoplastic radiation therapy: Secondary | ICD-10-CM | POA: Diagnosis not present

## 2019-06-07 DIAGNOSIS — C61 Malignant neoplasm of prostate: Secondary | ICD-10-CM | POA: Diagnosis not present

## 2019-06-07 DIAGNOSIS — R3915 Urgency of urination: Secondary | ICD-10-CM | POA: Diagnosis not present

## 2019-06-08 ENCOUNTER — Other Ambulatory Visit: Payer: Self-pay

## 2019-06-08 ENCOUNTER — Ambulatory Visit
Admission: RE | Admit: 2019-06-08 | Discharge: 2019-06-08 | Disposition: A | Discharge status: Home / Self Care | Payer: Medicare Other | Source: Ambulatory Visit | Attending: Radiation Oncology | Admitting: Radiation Oncology

## 2019-06-08 DIAGNOSIS — R350 Frequency of micturition: Secondary | ICD-10-CM | POA: Diagnosis not present

## 2019-06-08 DIAGNOSIS — R3915 Urgency of urination: Secondary | ICD-10-CM | POA: Diagnosis not present

## 2019-06-08 DIAGNOSIS — Z510 Encounter for antineoplastic radiation therapy: Secondary | ICD-10-CM | POA: Diagnosis not present

## 2019-06-08 DIAGNOSIS — C61 Malignant neoplasm of prostate: Secondary | ICD-10-CM | POA: Diagnosis not present

## 2019-06-09 ENCOUNTER — Ambulatory Visit
Admission: RE | Admit: 2019-06-09 | Discharge: 2019-06-09 | Disposition: A | Discharge status: Home / Self Care | Payer: Medicare Other | Source: Ambulatory Visit | Attending: Radiation Oncology | Admitting: Radiation Oncology

## 2019-06-09 ENCOUNTER — Encounter: Payer: Self-pay | Admitting: Medical Oncology

## 2019-06-09 ENCOUNTER — Encounter: Payer: Self-pay | Admitting: Urology

## 2019-06-09 ENCOUNTER — Other Ambulatory Visit: Payer: Self-pay

## 2019-06-09 ENCOUNTER — Ambulatory Visit: Payer: Medicare Other

## 2019-06-09 DIAGNOSIS — R350 Frequency of micturition: Secondary | ICD-10-CM | POA: Diagnosis not present

## 2019-06-09 DIAGNOSIS — Z510 Encounter for antineoplastic radiation therapy: Secondary | ICD-10-CM | POA: Diagnosis not present

## 2019-06-09 DIAGNOSIS — R3915 Urgency of urination: Secondary | ICD-10-CM | POA: Diagnosis not present

## 2019-06-09 DIAGNOSIS — C61 Malignant neoplasm of prostate: Secondary | ICD-10-CM | POA: Diagnosis not present

## 2019-06-12 ENCOUNTER — Ambulatory Visit: Payer: Medicare Other

## 2019-07-11 ENCOUNTER — Encounter: Payer: Self-pay | Admitting: Urology

## 2019-07-11 ENCOUNTER — Telehealth: Payer: Self-pay

## 2019-07-11 ENCOUNTER — Other Ambulatory Visit: Payer: Self-pay

## 2019-07-11 NOTE — Telephone Encounter (Signed)
Spoke with patient in regards to telephone visit with Ashlyn Bruning 07/11/19 at 2:30pm t verbalized understanding. Meaningful use questions were done. TM

## 2019-07-11 NOTE — Progress Notes (Signed)
Spoke with via telephone to review meaningful use and prostate symptom questions for telephone appointment with Benjamin Wilkerson tomorrow 07/11/19 at 2:30pm. Pt verbalized understanding of appointment date and time. Pt states that he has not scheduled a follow-up appointment with [facility] yet. Pt states nocturia 2 times per night. Pt denies dysuria. Pt states that bowel movements are regular. Pt states that he is emptying his bladder completely. Pt denies having urgency or leakage. Pt states that he has hesitancy at night.

## 2019-07-12 ENCOUNTER — Ambulatory Visit
Admission: RE | Admit: 2019-07-12 | Discharge: 2019-07-12 | Disposition: A | Discharge status: Home / Self Care | Payer: Medicare Other | Source: Ambulatory Visit | Attending: Urology | Admitting: Urology

## 2019-07-12 ENCOUNTER — Other Ambulatory Visit: Payer: Self-pay

## 2019-07-12 DIAGNOSIS — C61 Malignant neoplasm of prostate: Secondary | ICD-10-CM

## 2019-07-12 NOTE — Addendum Note (Signed)
Encounter addended by: Freeman Caldron, PA-C on: 07/12/2019 3:22 PM  Actions taken: Clinical Note Signed

## 2019-07-12 NOTE — Progress Notes (Addendum)
Radiation Oncology         [phone] ________________________________  Name: Benjamin Wilkerson MRN: [id number]  Date: 07/12/2019  DOB: 03-10-1945  Post Treatment Note  CC: [name], MD  [name], MD  Diagnosis:   74 y.o. gentleman with Stage T1c adenocarcinoma of the prostate with Gleason score of 4+3, and PSA of 10.2.  Interval Since Last Radiation:  4.5 weeks, concurrent with ADT (started ADT 02/28/19)  05/03/19 - 06/09/19:  The prostate was treated to 70 Gy in 28 fractions of 2.5 Gy  Narrative:  I spoke with the patient to conduct his routine scheduled 1 month follow up visit via telephone to spare the patient unnecessary potential exposure in the healthcare setting during the current COVID-19 pandemic.  The patient was notified in advance and gave permission to proceed with this visit format. He tolerated radiation treatment relatively well with only minor urinary irritation with increased frequency, urgency and nocturia 2-3x/night and modest fatigue.                                 On review of systems, the patient states that he is doing quite well in general.  He reports some ongoing hesitancy in initiating his urine stream in the evenings and nocturia 2-3 times per night but otherwise feels that his LUTS are improving as expected.  He specifically denies dysuria, gross hematuria, excessive daytime frequency, urgency, straining to void, incomplete bladder emptying or incontinence.  He reports that his bowel movements are back to normal and regular.  He denies any abdominal pain, nausea, vomiting, diarrhea or constipation.  He reports a healthy appetite and is maintaining his weight.  Overall, he is quite pleased with his progress to date.  ALLERGIES:  has No Known Allergies.  Meds: Current Outpatient Medications  Medication Sig Dispense Refill  . aspirin EC 81 MG tablet Take 81 mg by mouth daily.    Marland Kitchen atorvastatin (LIPITOR) 20 MG tablet Take 1 tablet (20 mg total) by  mouth daily. 90 tablet 3  . metoprolol succinate (TOPROL-XL) 50 MG 24 hr tablet Take with or immediately following a meal. 90 tablet 3  . sildenafil (VIAGRA) 100 MG tablet Take 0.5-1 tablets (50-100 mg total) by mouth daily as needed for erectile dysfunction. 10 tablet 11   No current facility-administered medications for this encounter.    Physical Findings:  vitals were not taken for this visit.   /Unable to assess due to telephone follow up visit format.  Lab Findings: Lab Results  Component Value Date   WBC 9.0 11/08/2018   HGB 15.9 11/08/2018   HCT 46.7 11/08/2018   MCV 96.5 11/08/2018   PLT 215.0 11/08/2018     Radiographic Findings: No results found.  Impression/Plan: 1. 74 y.o. gentleman with Stage T1c adenocarcinoma of the prostate with Gleason score of 4+3, and PSA of 10.2. He will continue to follow up with urology for ongoing PSA determinations but does not currently have an appointment scheduled with Dr. [name] for follow up to his knowledge.  He is advised to expect a call from [facility] to schedule a follow-up visit with Dr. [name] sometime in early August 2021 for a repeat PSA.  However, if he has not heard from [facility] urology by this time next week, he will call to make arrangements for a follow-up visit.  He understands what to expect with regards to PSA monitoring going  forward. I will look forward to following his response to treatment via correspondence with urology, and would be happy to continue to participate in his care if clinically indicated. I talked to the patient about what to expect in the future, including his risk for erectile dysfunction and rectal bleeding. I encouraged him to call or return to the office if he has any questions regarding his previous radiation or possible radiation side effects. He was comfortable with this plan and will follow up as needed.  Today, a comprehensive survivorship care plan and treatment summary was reviewed with the  patient today detailing his prostate cancer diagnosis, treatment course, potential late/long-term effects of treatment, appropriate follow-up care with recommendations for the future, and patient education resources.  A copy of this summary, along with a letter will be sent to the patient's primary care provider via In Basket message after today's visit.    2. Cancer screening:  Due to [name] history and his age, he should receive screening for skin cancers and colon cancer.  The information and recommendations are listed on the patient's comprehensive care plan/treatment summary and were reviewed in detail with the patient.      3. Health maintenance and wellness promotion: [name] was encouraged to consume 5-7 servings of fruits and vegetables per day. He was provided a copy of the "Nutrition Rainbow" handout, as well as the handout "Take Control of Your Health and [place]" from the [place].  He was also encouraged to engage in moderate to vigorous exercise for 30 minutes per day most days of the week. Information was provided regarding the [employer] fitness program, which is designed for cancer survivors to help them become more physically fit after cancer treatments. We discussed that a healthy BMI is 18.5-24.9 and that maintaining a healthy weight reduces risk of cancer recurrences.  He was instructed to limit his alcohol consumption and continue to abstain from tobacco use.  Lastly, he was encouraged to use sunscreen and wear protective clothing when in the sun.     4. Support services/counseling: It is not uncommon for this period of the patient's cancer care trajectory to be one of many emotions and stressors.  [name] was encouraged to take advantage of our many support services programs, support groups, and/or counseling in coping with his new life as a cancer survivor after completing anti-cancer treatment.  He was offered support today through active  listening and expressive supportive counseling.  He was given information regarding our available services and encouraged to contact me with any questions or for help enrolling in any of our support group/programs.       Nicholos Johns, PA-C

## 2019-09-08 NOTE — Progress Notes (Signed)
  Radiation Oncology         [phone] ________________________________  Name: Benjamin Wilkerson MRN: [id number]  Date: 06/09/2019  DOB: [birth year]  End of Treatment Note  Diagnosis:   74 y.o.gentleman with Stage T1cadenocarcinoma of the prostate with Gleason score of 4+3, and PSA of10.2.     Indication for treatment:  Curative, Definitive Radiotherapy       Radiation treatment dates:   05/03/19 - 06/09/19, concurrent with ADT (started ADT 02/28/19)   Site/dose:   The prostate was treated to 70 Gy in 28 fractions of 2.5 Gy  Beams/energy:   The patient was treated with IMRT using volumetric arc therapy delivering 6 MV X-rays to clockwise and counterclockwise circumferential arcs with a 90 degree collimator offset to avoid dose scalloping.  Image guidance was performed with daily cone beam CT prior to each fraction to align to gold markers in the prostate and assure proper bladder and rectal fill volumes.  Immobilization was achieved with BodyFix custom mold.  Narrative: The patient tolerated radiation treatment relatively well with only minor urinary irritation with increased frequency, urgency and nocturia 2-3x/night and modest fatigue.   Plan: The patient has completed radiation treatment. He will return to radiation oncology clinic for routine followup in one month. I advised him to call or return sooner if he has any questions or concerns related to his recovery or treatment. ________________________________  Sheral Apley. Tammi Klippel, M.D.

## 2019-09-28 DIAGNOSIS — N401 Enlarged prostate with lower urinary tract symptoms: Secondary | ICD-10-CM | POA: Diagnosis not present

## 2019-09-28 DIAGNOSIS — C61 Malignant neoplasm of prostate: Secondary | ICD-10-CM | POA: Diagnosis not present

## 2019-09-28 DIAGNOSIS — R350 Frequency of micturition: Secondary | ICD-10-CM | POA: Diagnosis not present

## 2019-11-25 ENCOUNTER — Other Ambulatory Visit: Payer: Self-pay | Admitting: Internal Medicine

## 2019-11-25 DIAGNOSIS — I10 Essential (primary) hypertension: Secondary | ICD-10-CM

## 2019-11-25 NOTE — Telephone Encounter (Signed)
Please refill as per office routine med refill policy (all routine meds refilled for 3 mo or monthly per pt preference up to one year from last visit, then month to month grace period for 3 mo, then further med refills will have to be denied)  

## 2020-01-25 DIAGNOSIS — Z23 Encounter for immunization: Secondary | ICD-10-CM | POA: Diagnosis not present

## 2020-05-02 DIAGNOSIS — C61 Malignant neoplasm of prostate: Secondary | ICD-10-CM | POA: Diagnosis not present

## 2020-05-10 DIAGNOSIS — C61 Malignant neoplasm of prostate: Secondary | ICD-10-CM | POA: Diagnosis not present

## 2020-05-23 ENCOUNTER — Other Ambulatory Visit: Payer: Self-pay | Admitting: Internal Medicine

## 2020-05-23 DIAGNOSIS — I10 Essential (primary) hypertension: Secondary | ICD-10-CM

## 2020-07-15 ENCOUNTER — Other Ambulatory Visit: Payer: Self-pay

## 2020-07-15 ENCOUNTER — Encounter: Payer: Self-pay | Admitting: Internal Medicine

## 2020-07-15 ENCOUNTER — Ambulatory Visit (INDEPENDENT_AMBULATORY_CARE_PROVIDER_SITE_OTHER): Payer: Medicare Other | Admitting: Internal Medicine

## 2020-07-15 VITALS — BP 158/80 | HR 80 | Ht 70.0 in | Wt 257.9 lb

## 2020-07-15 DIAGNOSIS — M79671 Pain in right foot: Secondary | ICD-10-CM | POA: Diagnosis not present

## 2020-07-15 DIAGNOSIS — N320 Bladder-neck obstruction: Secondary | ICD-10-CM | POA: Diagnosis not present

## 2020-07-15 DIAGNOSIS — E559 Vitamin D deficiency, unspecified: Secondary | ICD-10-CM | POA: Diagnosis not present

## 2020-07-15 DIAGNOSIS — I10 Essential (primary) hypertension: Secondary | ICD-10-CM

## 2020-07-15 DIAGNOSIS — R739 Hyperglycemia, unspecified: Secondary | ICD-10-CM | POA: Diagnosis not present

## 2020-07-15 DIAGNOSIS — C61 Malignant neoplasm of prostate: Secondary | ICD-10-CM | POA: Diagnosis not present

## 2020-07-15 DIAGNOSIS — E538 Deficiency of other specified B group vitamins: Secondary | ICD-10-CM | POA: Diagnosis not present

## 2020-07-15 DIAGNOSIS — E7800 Pure hypercholesterolemia, unspecified: Secondary | ICD-10-CM | POA: Diagnosis not present

## 2020-07-15 LAB — HEPATIC FUNCTION PANEL
ALT: 21 U/L (ref 0–53)
AST: 14 U/L (ref 0–37)
Albumin: 4.5 g/dL (ref 3.5–5.2)
Alkaline Phosphatase: 74 U/L (ref 39–117)
Bilirubin, Direct: 0.1 mg/dL (ref 0.0–0.3)
Total Bilirubin: 0.5 mg/dL (ref 0.2–1.2)
Total Protein: 7 g/dL (ref 6.0–8.3)

## 2020-07-15 LAB — LIPID PANEL
Cholesterol: 211 mg/dL — ABNORMAL HIGH (ref 0–200)
HDL: 38.5 mg/dL — ABNORMAL LOW (ref >39.00)
LDL Cholesterol: 135 mg/dL — ABNORMAL HIGH (ref 0–99)
NonHDL: 172.89
Total CHOL/HDL Ratio: 5
Triglycerides: 187 mg/dL — ABNORMAL HIGH (ref 0.0–149.0)
VLDL: 37.4 mg/dL (ref 0.0–40.0)

## 2020-07-15 LAB — BASIC METABOLIC PANEL
BUN: 12 mg/dL (ref 6–23)
CO2: 27 mEq/L (ref 19–32)
Calcium: 9.1 mg/dL (ref 8.4–10.5)
Chloride: 104 mEq/L (ref 96–112)
Creatinine, Ser: 1.08 mg/dL (ref 0.40–1.50)
GFR: 68.33 mL/min (ref >60.00)
Glucose, Bld: 116 mg/dL — ABNORMAL HIGH (ref 70–99)
Potassium: 4.4 mEq/L (ref 3.5–5.1)
Sodium: 139 mEq/L (ref 135–145)

## 2020-07-15 LAB — CBC WITH DIFFERENTIAL/PLATELET
Basophils Absolute: 0.1 10*3/uL (ref 0.0–0.1)
Basophils Relative: 1.4 % (ref 0.0–3.0)
Eosinophils Absolute: 0.2 10*3/uL (ref 0.0–0.7)
Eosinophils Relative: 2.5 % (ref 0.0–5.0)
HCT: 45.1 % (ref 39.0–52.0)
Hemoglobin: 15.6 g/dL (ref 13.0–17.0)
Lymphocytes Relative: 15.5 % (ref 12.0–46.0)
Lymphs Abs: 1.3 10*3/uL (ref 0.7–4.0)
MCHC: 34.5 g/dL (ref 30.0–36.0)
MCV: 95.7 fl (ref 78.0–100.0)
Monocytes Absolute: 0.6 10*3/uL (ref 0.1–1.0)
Monocytes Relative: 6.6 % (ref 3.0–12.0)
Neutro Abs: 6.3 10*3/uL (ref 1.4–7.7)
Neutrophils Relative %: 74 % (ref 43.0–77.0)
Platelets: 220 10*3/uL (ref 150.0–400.0)
RBC: 4.71 Mil/uL (ref 4.22–5.81)
RDW: 13.6 % (ref 11.5–15.5)
WBC: 8.6 10*3/uL (ref 4.0–10.5)

## 2020-07-15 LAB — TSH: TSH: 2.47 u[IU]/mL (ref 0.35–4.50)

## 2020-07-15 LAB — HEMOGLOBIN A1C: Hgb A1c MFr Bld: 6 % (ref 4.6–6.5)

## 2020-07-15 LAB — VITAMIN B12: Vitamin B-12: 233 pg/mL (ref 211–911)

## 2020-07-15 LAB — PSA: PSA: 0.15 ng/mL (ref 0.10–4.00)

## 2020-07-15 LAB — VITAMIN D 25 HYDROXY (VIT D DEFICIENCY, FRACTURES): VITD: 14.4 ng/mL — ABNORMAL LOW (ref 30.00–100.00)

## 2020-07-15 MED ORDER — ATORVASTATIN CALCIUM 20 MG PO TABS: 1.0000 | ORAL_TABLET | Freq: Every day | ORAL | 1 refills | Status: DC

## 2020-07-15 MED ORDER — AMLODIPINE BESYLATE 5 MG PO TABS: 5.0000 mg | ORAL_TABLET | Freq: Every day | ORAL | 3 refills | Status: DC

## 2020-07-15 MED ORDER — METOPROLOL SUCCINATE ER 50 MG PO TB24: ORAL_TABLET | ORAL | 1 refills | Status: DC

## 2020-07-15 NOTE — Patient Instructions (Addendum)
Please see Sports Medicine on the first floor for any worsening of the right heel  Please take OTC Vitamin D3 at 2000 units per day, indefinitely  Your BP was rechecked in the office; Please take all new medication as prescribed - the amlodipine 5 qd  Please continue all other medications as before, and refills have been done if requested.  Please have the pharmacy call with any other refills you may need.  Please continue your efforts at being more active, low cholesterol diet, and weight control.  You are otherwise up to date with prevention measures today.  Please keep your appointments with your specialists as you may have planned  Please go to the LAB at the blood drawing area for the tests to be done  You will be contacted by phone if any changes need to be made immediately.  Otherwise, you will receive a letter about your results with an explanation, but please check with MyChart first.  Please remember to sign up for MyChart if you have not done so, as this will be important to you in the future with finding out test results, communicating by private email, and scheduling acute appointments online when needed.  Please make an Appointment to return for your 1 year visit, or sooner if needed

## 2020-07-15 NOTE — Assessment & Plan Note (Signed)
Last vitamin D Lab Results  Component Value Date   VD25OH 18.12 (L) 11/08/2018   Low, to start oral replacement

## 2020-07-15 NOTE — Progress Notes (Signed)
Patient ID: Benjamin Wilkerson, male   DOB: [birth year], 75 y.o.   MRN: [id number]         Chief Complaint:: yearly exam       HPI:  Benjamin Wilkerson is a 75 y.o. male here for f/u, overall doing well.  BP has been mild elevated at home, similar to here.  Pt denies chest pain, increased sob or doe, wheezing, orthopnea, PND, increased LE swelling, palpitations, dizziness or syncope.   Pt denies polydipsia, polyuria, or new focal neuro s/s.   Pt denies fever, wt loss, night sweats, loss of appetite, or other constitutional symptoms  Denies worsening reflux, abd pain, dysphagia, n/v, bowel change or blood.  No other neew complaints.  Not taking Vit D  Does have mild right heel tenderness intermittent for 1 mo, worse to walk, better to sit, nothing else makes bettter or worse.  Moved recently from [place] to [place].   Hs gained wt with anti-hormone shots and xrt.  Denies urinary symptoms such as dysuria, frequency, urgency, flank pain, hematuria or n/v, fever, chills.  Has 6 mo f/u with urology planned [facility] Readings from Last 3 Encounters:  07/15/20 257 lb 15 oz (117 kg)  02/14/19 230 lb (104.3 kg)  11/08/18 243 lb (110.2 kg)   BP Readings from Last 3 Encounters:  07/15/20 (!) 158/80  11/08/18 (!) 140/92  06/16/17 132/80   Immunization History  Administered Date(s) Administered   Influenza,inj,Quad PF,6+ Mos 11/10/2013   PFIZER(Purple Top)SARS-COV-2 Vaccination 04/09/2019, 05/09/2019   There are no preventive care reminders to display for this patient.     Past Medical History:  Diagnosis Date   Hyperlipidemia    Hypertension    Prostate cancer [facility])    Past Surgical History:  Procedure Laterality Date   FRACTURE SURGERY     left wrist and arm    reports that he has never smoked. He has never used smokeless tobacco. He reports current alcohol use of about 7.0 - 14.0 standard drinks of alcohol per week. He reports that he does not use drugs. family history includes Alcohol  abuse in his father; Diabetes in his mother; Heart disease in his father and mother. No Known Allergies Current Outpatient Medications on File Prior to Visit  Medication Sig Dispense Refill   aspirin EC 81 MG tablet Take 81 mg by mouth daily.     sildenafil (VIAGRA) 100 MG tablet Take 0.5-1 tablets (50-100 mg total) by mouth daily as needed for erectile dysfunction. 10 tablet 11   No current facility-administered medications on file prior to visit.        ROS:  All others reviewed and negative.  Objective        PE:  BP (!) 158/80   Pulse 80   Ht 5\' 10"  (1.778 m)   Wt 257 lb 15 oz (117 kg)   SpO2 96%   BMI 37.01 kg/m                 Constitutional: Pt appears in NAD               HENT: Head: NCAT.                Right Ear: External ear normal.                 Left Ear: External ear normal.                Eyes: . Pupils are equal, round, and  reactive to light. Conjunctivae and EOM are normal               Nose: without d/c or deformity               Neck: Neck supple. Gross normal ROM               Cardiovascular: Normal rate and regular rhythm.                 Pulmonary/Chest: Effort normal and breath sounds without rales or wheezing.                Abd:  Soft, NT, ND, + BS, no organomegaly               Neurological: Pt is alert. At baseline orientation, motor grossly intact               Skin: Skin is warm. No rashes, no other new lesions, LE edema - none; right heel mild tender to palpate without ulcer swellig erythema                Psychiatric: Pt behavior is normal without agitation   Micro: none  Cardiac tracings I have personally interpreted today:  none  Pertinent Radiological findings (summarize): none   Lab Results  Component Value Date   WBC 8.6 07/15/2020   HGB 15.6 07/15/2020   HCT 45.1 07/15/2020   PLT 220.0 07/15/2020   GLUCOSE 116 (H) 07/15/2020   CHOL 211 (H) 07/15/2020   TRIG 187.0 (H) 07/15/2020   HDL 38.50 (L) 07/15/2020   LDLDIRECT 187.8  02/04/2009   LDLCALC 135 (H) 07/15/2020   ALT 21 07/15/2020   AST 14 07/15/2020   NA 139 07/15/2020   K 4.4 07/15/2020   CL 104 07/15/2020   CREATININE 1.08 07/15/2020   BUN 12 07/15/2020   CO2 27 07/15/2020   TSH 2.47 07/15/2020   PSA 0.15 07/15/2020   HGBA1C 6.0 07/15/2020   Assessment/Plan:  Benjamin Wilkerson is a 75 y.o. White or Caucasian [1] male with  has a past medical history of Hyperlipidemia, Hypertension, and Prostate cancer ([place]).  Vitamin D deficiency Last vitamin D Lab Results  Component Value Date   VD25OH 18.12 (L) 11/08/2018   Low, to start oral replacement   HLD (hyperlipidemia) Lab Results  Component Value Date   LDLCALC 135 (H) 07/15/2020   Stable, pt to increase the lipitor to 40 mg per day, follow lower chol diet,  to f/u any worsening symptoms or concerns   Essential hypertension Uncontrolled,  BP Readings from Last 3 Encounters:  07/15/20 (!) 158/80  11/08/18 (!) 140/92  06/16/17 132/80   Stable, pt to continue medical treatment  toprol and add amlodipien 5 qd, f/u bp at home and next visit   Pain of right heel Mild overall, c/w plantar fasciitis, for heel cushions, soft soled shoes, and declines podiatry referral for now  Followup: Return in about 1 year (around 07/15/2021).  Cathlean Cower, MD 07/17/2020 10:27 PM [facility]

## 2020-07-16 ENCOUNTER — Other Ambulatory Visit: Payer: Self-pay | Admitting: Internal Medicine

## 2020-07-16 ENCOUNTER — Encounter: Payer: Self-pay | Admitting: Internal Medicine

## 2020-07-16 LAB — URINALYSIS, ROUTINE W REFLEX MICROSCOPIC
Bilirubin Urine: NEGATIVE
Hgb urine dipstick: NEGATIVE
Ketones, ur: NEGATIVE
Leukocytes,Ua: NEGATIVE
Nitrite: NEGATIVE
RBC / HPF: NONE SEEN (ref 0–?)
Specific Gravity, Urine: 1.025 (ref 1.000–1.030)
Total Protein, Urine: NEGATIVE
Urine Glucose: NEGATIVE
Urobilinogen, UA: 0.2 (ref 0.0–1.0)
pH: 6 (ref 5.0–8.0)

## 2020-07-16 MED ORDER — ATORVASTATIN CALCIUM 40 MG PO TABS: 40.0000 mg | ORAL_TABLET | Freq: Every day | ORAL | 3 refills | Status: DC

## 2020-07-17 ENCOUNTER — Encounter: Payer: Self-pay | Admitting: Internal Medicine

## 2020-07-17 ENCOUNTER — Telehealth: Payer: Self-pay | Admitting: Internal Medicine

## 2020-07-17 NOTE — Telephone Encounter (Signed)
Please return  call to discuss lab results 

## 2020-07-17 NOTE — Assessment & Plan Note (Signed)
Mild overall, c/w plantar fasciitis, for heel cushions, soft soled shoes, and declines podiatry referral for now

## 2020-07-17 NOTE — Assessment & Plan Note (Signed)
Uncontrolled,  BP Readings from Last 3 Encounters:  07/15/20 (!) 158/80  11/08/18 (!) 140/92  06/16/17 132/80   Stable, pt to continue medical treatment  toprol and add amlodipien 5 qd, f/u bp at home and next visit

## 2020-07-17 NOTE — Assessment & Plan Note (Signed)
Lab Results  Component Value Date   LDLCALC 135 (H) 07/15/2020   Stable, pt to increase the lipitor to 40 mg per day, follow lower chol diet,  to f/u any worsening symptoms or concerns

## 2020-07-18 NOTE — Telephone Encounter (Signed)
Results given to patient and medication list discussed.

## 2020-08-30 DIAGNOSIS — Z23 Encounter for immunization: Secondary | ICD-10-CM | POA: Diagnosis not present

## 2020-09-11 DIAGNOSIS — U071 COVID-19: Secondary | ICD-10-CM | POA: Diagnosis not present

## 2020-10-21 IMAGING — NM NM BONE WHOLE BODY
2 series · 2 of 2 positions shown · non-contrast
Comparison: CT 02/07/2019.

CLINICAL DATA: Prostate cancer.

EXAM:
NUCLEAR MEDICINE WHOLE BODY BONE SCAN
TECHNIQUE: Whole body anterior and posterior images were obtained approximately
3 hours after intravenous injection of radiopharmaceutical.
RADIOPHARMACEUTICALS:  21.5 mCi Eechnetium-IIm MDP IV

[Series 1: whole body · 2.66mm/px · 1 of 1 slices shown (1 of 2)]
[im 1/1]
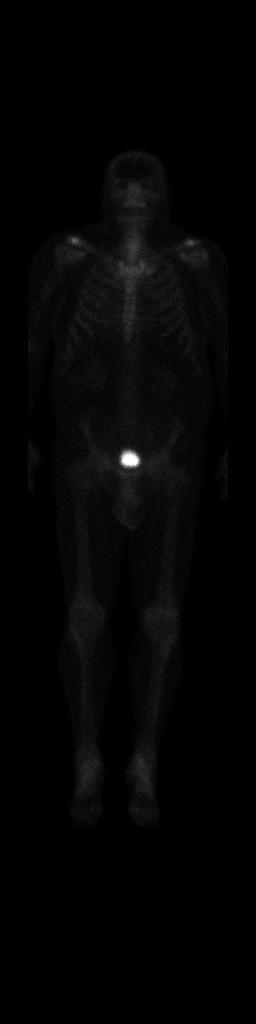

[Series 1: whole body · 2.66mm/px · 1 of 1 slices shown (2 of 2)]
[im 1/1]
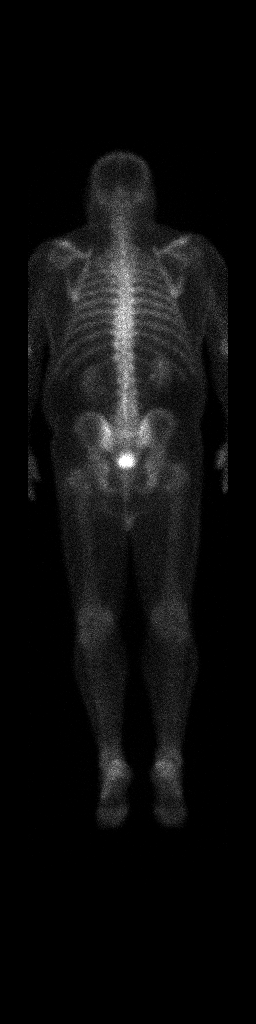

[2 of 2 positions shown; findings below may reference images not displayed]

FINDINGS: Bilateral renal function and excretion. No focal bony abnormality is
identified. No evidence of metastatic disease.
IMPRESSION: No evidence of metastatic disease.

## 2020-11-15 DIAGNOSIS — C61 Malignant neoplasm of prostate: Secondary | ICD-10-CM | POA: Diagnosis not present

## 2020-11-22 DIAGNOSIS — C61 Malignant neoplasm of prostate: Secondary | ICD-10-CM | POA: Diagnosis not present

## 2021-01-02 ENCOUNTER — Other Ambulatory Visit: Payer: Self-pay | Admitting: Internal Medicine

## 2021-01-02 DIAGNOSIS — I10 Essential (primary) hypertension: Secondary | ICD-10-CM

## 2021-01-02 NOTE — Telephone Encounter (Signed)
Please refill as per office routine med refill policy (all routine meds to be refilled for 3 mo or monthly (per pt preference) up to one year from last visit, then month to month grace period for 3 mo, then further med refills will have to be denied) ? ?

## 2021-01-15 ENCOUNTER — Other Ambulatory Visit: Payer: Self-pay

## 2021-01-15 ENCOUNTER — Encounter: Payer: Self-pay | Admitting: Internal Medicine

## 2021-01-15 ENCOUNTER — Ambulatory Visit (INDEPENDENT_AMBULATORY_CARE_PROVIDER_SITE_OTHER): Payer: Medicare Other | Admitting: Internal Medicine

## 2021-01-15 VITALS — BP 126/80 | HR 60 | Resp 18 | Ht 70.0 in | Wt 261.4 lb

## 2021-01-15 DIAGNOSIS — R739 Hyperglycemia, unspecified: Secondary | ICD-10-CM

## 2021-01-15 DIAGNOSIS — E7800 Pure hypercholesterolemia, unspecified: Secondary | ICD-10-CM

## 2021-01-15 DIAGNOSIS — I10 Essential (primary) hypertension: Secondary | ICD-10-CM | POA: Diagnosis not present

## 2021-01-15 DIAGNOSIS — E538 Deficiency of other specified B group vitamins: Secondary | ICD-10-CM

## 2021-01-15 DIAGNOSIS — E559 Vitamin D deficiency, unspecified: Secondary | ICD-10-CM | POA: Diagnosis not present

## 2021-01-15 DIAGNOSIS — N320 Bladder-neck obstruction: Secondary | ICD-10-CM | POA: Diagnosis not present

## 2021-01-15 MED ORDER — AMLODIPINE BESYLATE 5 MG PO TABS: 5.0000 mg | ORAL_TABLET | Freq: Every day | ORAL | 3 refills | Status: DC

## 2021-01-15 MED ORDER — ATORVASTATIN CALCIUM 40 MG PO TABS: 40.0000 mg | ORAL_TABLET | Freq: Every day | ORAL | 3 refills | Status: DC

## 2021-01-15 MED ORDER — METOPROLOL SUCCINATE ER 50 MG PO TB24: ORAL_TABLET | ORAL | 3 refills | Status: DC

## 2021-01-15 NOTE — Assessment & Plan Note (Signed)
BP Readings from Last 3 Encounters:  01/15/21 126/80  07/15/20 (!) 158/80  11/08/18 (!) 140/92   Stable, pt to continue medical treatment norvasc, toprol

## 2021-01-15 NOTE — Patient Instructions (Signed)

## 2021-01-15 NOTE — Progress Notes (Signed)
Patient ID: Benjamin Wilkerson, male   DOB: [birth year], 76 y.o.   MRN: [id number]         Chief Complaint:: yearly exam       HPI:  Benjamin Wilkerson is a 75 y.o. male heree to f/u, overall doing well;  Pt denies chest pain, increased sob or doe, wheezing, orthopnea, PND, increased LE swelling, palpitations, dizziness or syncope.   Pt denies polydipsia, polyuria, or new focal neuro s/s.   Pt denies fever, wt loss, night sweats, loss of appetite, or other constitutional symptoms  No other new complaints.  Good med compliance.   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                         Wt Readings from Last 3 Encounters:  01/15/21 261 lb 6.4 oz (118.6 kg)  07/15/20 257 lb 15 oz (117 kg)  02/14/19 230 lb (104.3 kg)   BP Readings from Last 3 Encounters:  01/15/21 126/80  07/15/20 (!) 158/80  11/08/18 (!) 140/92   Immunization History  Administered Date(s) Administered   Influenza,inj,Quad PF,6+ Mos 11/10/2013   PFIZER(Purple Top)SARS-COV-2 Vaccination 04/09/2019, 05/09/2019, 11/20/2019, 06/24/2020   Health Maintenance Due  Topic Date Due   COVID-19 Vaccine (5 - Booster for [place] series) 08/19/2020      Past Medical History:  Diagnosis Date   Hyperlipidemia    Hypertension    Prostate cancer [facility])    Past Surgical History:  Procedure Laterality Date   FRACTURE SURGERY     left wrist and arm    reports that he has never smoked. He has never used smokeless tobacco. He reports current alcohol use of about 7.0 - 14.0 standard drinks per week. He reports that he does not use drugs. family history includes Alcohol abuse in his father; Diabetes in his mother; Heart disease in his father and mother. No Known Allergies Current Outpatient Medications on File Prior to Visit  Medication Sig Dispense Refill   aspirin EC 81 MG tablet Take 81 mg by mouth daily.     sildenafil (VIAGRA) 100 MG tablet Take 0.5-1 tablets (50-100 mg total) by mouth daily as needed for  erectile dysfunction. 10 tablet 11   No current facility-administered medications on file prior to visit.        ROS:  All others reviewed and negative.  Objective        PE:  BP 126/80    Pulse 60    Resp 18    Ht 5\' 10"  (1.778 m)    Wt 261 lb 6.4 oz (118.6 kg)    SpO2 96%    BMI 37.51 kg/m                 Constitutional: Pt appears in NAD               HENT: Head: NCAT.                Right Ear: External ear normal.                 Left Ear: External ear normal.                Eyes: . Pupils are equal, round, and reactive to light. Conjunctivae and EOM are normal               Nose: without d/c or deformity  Neck: Neck supple. Gross normal ROM               Cardiovascular: Normal rate and regular rhythm.                 Pulmonary/Chest: Effort normal and breath sounds without rales or wheezing.                Abd:  Soft, NT, ND, + BS, no organomegaly               Neurological: Pt is alert. At baseline orientation, motor grossly intact               Skin: Skin is warm. No rashes, no other new lesions, LE edema - none               Psychiatric: Pt behavior is normal without agitation   Micro: none  Cardiac tracings I have personally interpreted today:  none  Pertinent Radiological findings (summarize): none   Lab Results  Component Value Date   WBC 8.6 07/15/2020   HGB 15.6 07/15/2020   HCT 45.1 07/15/2020   PLT 220.0 07/15/2020   GLUCOSE 116 (H) 07/15/2020   CHOL 211 (H) 07/15/2020   TRIG 187.0 (H) 07/15/2020   HDL 38.50 (L) 07/15/2020   LDLDIRECT 187.8 02/04/2009   LDLCALC 135 (H) 07/15/2020   ALT 21 07/15/2020   AST 14 07/15/2020   NA 139 07/15/2020   K 4.4 07/15/2020   CL 104 07/15/2020   CREATININE 1.08 07/15/2020   BUN 12 07/15/2020   CO2 27 07/15/2020   TSH 2.47 07/15/2020   PSA 0.15 07/15/2020   HGBA1C 6.0 07/15/2020   Assessment/Plan:  Benjamin Wilkerson is a 75 y.o. White or Caucasian [1] male with  has a past medical history of  Hyperlipidemia, Hypertension, and Prostate cancer ([place]).  HLD (hyperlipidemia) Lab Results  Component Value Date   LDLCALC 135 (H) 07/15/2020   uncontrolled, pt to continue current statin lipitor 40, goal ldl < 100 - for f/i lab today   Essential hypertension BP Readings from Last 3 Encounters:  01/15/21 126/80  07/15/20 (!) 158/80  11/08/18 (!) 140/92   Stable, pt to continue medical treatment norvasc, toprol   Vitamin D deficiency Last vitamin D Lab Results  Component Value Date   VD25OH 14.40 (L) 07/15/2020   Low, to start oral replacement  Followup: No follow-ups on file.  Cathlean Cower, MD 01/15/2021 7:38 PM [facility]

## 2021-01-15 NOTE — Assessment & Plan Note (Signed)
Last vitamin D Lab Results  Component Value Date   VD25OH 14.40 (L) 07/15/2020   Low, to start oral replacement

## 2021-01-15 NOTE — Assessment & Plan Note (Signed)
Lab Results  Component Value Date   LDLCALC 135 (H) 07/15/2020   uncontrolled, pt to continue current statin lipitor 40, goal ldl < 100 - for f/i lab today

## 2021-01-16 LAB — LIPID PANEL
Cholesterol: 198 mg/dL (ref 0–200)
HDL: 39.5 mg/dL (ref >39.00)
LDL Cholesterol: 122 mg/dL — ABNORMAL HIGH (ref 0–99)
NonHDL: 158.18
Total CHOL/HDL Ratio: 5
Triglycerides: 179 mg/dL — ABNORMAL HIGH (ref 0.0–149.0)
VLDL: 35.8 mg/dL (ref 0.0–40.0)

## 2021-01-16 LAB — URINALYSIS, ROUTINE W REFLEX MICROSCOPIC
Bilirubin Urine: NEGATIVE
Hgb urine dipstick: NEGATIVE
Ketones, ur: NEGATIVE
Leukocytes,Ua: NEGATIVE
Nitrite: NEGATIVE
RBC / HPF: NONE SEEN (ref 0–?)
Specific Gravity, Urine: 1.02 (ref 1.000–1.030)
Total Protein, Urine: NEGATIVE
Urine Glucose: NEGATIVE
Urobilinogen, UA: 0.2 (ref 0.0–1.0)
WBC, UA: NONE SEEN (ref 0–?)
pH: 7 (ref 5.0–8.0)

## 2021-01-16 LAB — BASIC METABOLIC PANEL
BUN: 17 mg/dL (ref 6–23)
CO2: 28 mEq/L (ref 19–32)
Calcium: 9.7 mg/dL (ref 8.4–10.5)
Chloride: 103 mEq/L (ref 96–112)
Creatinine, Ser: 1.24 mg/dL (ref 0.40–1.50)
GFR: 57.69 mL/min — ABNORMAL LOW (ref >60.00)
Glucose, Bld: 79 mg/dL (ref 70–99)
Potassium: 4.5 mEq/L (ref 3.5–5.1)
Sodium: 139 mEq/L (ref 135–145)

## 2021-01-16 LAB — HEPATIC FUNCTION PANEL
ALT: 25 U/L (ref 0–53)
AST: 16 U/L (ref 0–37)
Albumin: 4.5 g/dL (ref 3.5–5.2)
Alkaline Phosphatase: 83 U/L (ref 39–117)
Bilirubin, Direct: 0.1 mg/dL (ref 0.0–0.3)
Total Bilirubin: 0.6 mg/dL (ref 0.2–1.2)
Total Protein: 7 g/dL (ref 6.0–8.3)

## 2021-01-16 LAB — VITAMIN B12: Vitamin B-12: 230 pg/mL (ref 211–911)

## 2021-01-16 LAB — CBC WITH DIFFERENTIAL/PLATELET
Basophils Absolute: 0.1 10*3/uL (ref 0.0–0.1)
Basophils Relative: 1.6 % (ref 0.0–3.0)
Eosinophils Absolute: 0.2 10*3/uL (ref 0.0–0.7)
Eosinophils Relative: 2.8 % (ref 0.0–5.0)
HCT: 46.8 % (ref 39.0–52.0)
Hemoglobin: 15.7 g/dL (ref 13.0–17.0)
Lymphocytes Relative: 17.7 % (ref 12.0–46.0)
Lymphs Abs: 1.5 10*3/uL (ref 0.7–4.0)
MCHC: 33.5 g/dL (ref 30.0–36.0)
MCV: 99.1 fl (ref 78.0–100.0)
Monocytes Absolute: 0.7 10*3/uL (ref 0.1–1.0)
Monocytes Relative: 8 % (ref 3.0–12.0)
Neutro Abs: 6.1 10*3/uL (ref 1.4–7.7)
Neutrophils Relative %: 69.9 % (ref 43.0–77.0)
Platelets: 216 10*3/uL (ref 150.0–400.0)
RBC: 4.72 Mil/uL (ref 4.22–5.81)
RDW: 13.3 % (ref 11.5–15.5)
WBC: 8.7 10*3/uL (ref 4.0–10.5)

## 2021-01-16 LAB — VITAMIN D 25 HYDROXY (VIT D DEFICIENCY, FRACTURES): VITD: 21.54 ng/mL — ABNORMAL LOW (ref 30.00–100.00)

## 2021-01-16 LAB — HEMOGLOBIN A1C: Hgb A1c MFr Bld: 6.2 % (ref 4.6–6.5)

## 2021-01-16 LAB — TSH: TSH: 3.3 u[IU]/mL (ref 0.35–5.50)

## 2021-01-16 LAB — PSA: PSA: 0.19 ng/mL (ref 0.10–4.00)

## 2021-01-17 ENCOUNTER — Encounter: Payer: Self-pay | Admitting: Internal Medicine

## 2021-02-25 ENCOUNTER — Telehealth (INDEPENDENT_AMBULATORY_CARE_PROVIDER_SITE_OTHER): Payer: Medicare Other | Admitting: Family Medicine

## 2021-02-25 DIAGNOSIS — U071 COVID-19: Secondary | ICD-10-CM

## 2021-02-25 MED ORDER — DOXYCYCLINE HYCLATE 100 MG PO TABS: 100.0000 mg | ORAL_TABLET | Freq: Two times a day (BID) | ORAL | 0 refills | Status: DC

## 2021-02-25 MED ORDER — BENZONATATE 100 MG PO CAPS: ORAL_CAPSULE | ORAL | 0 refills | Status: DC

## 2021-02-25 MED ORDER — NIRMATRELVIR/RITONAVIR (PAXLOVID) TABLET (RENAL DOSING): 2.0000 | ORAL_TABLET | Freq: Two times a day (BID) | ORAL | 0 refills | Status: AC

## 2021-02-25 NOTE — Progress Notes (Deleted)
Reached out to room patient 3x's went to voicemail.

## 2021-02-25 NOTE — Progress Notes (Signed)
Virtual Visit via Telephone Note  I connected with Benjamin Wilkerson on 02/25/21 at 10:00 AM EST by telephone and verified that I am speaking with the correct person using two identifiers.   I discussed the limitations of performing an evaluation and management service by telephone and requested permission for a phone visit. The patient expressed understanding and agreed to proceed.  Location patient:  [place] Location provider: work or home office Participants present for the call: patient, provider Patient did not have a visit with me in the prior 7 days to address this/these issue(s).   History of Present Illness:  Acute telemedicine visit for Covid19, Sinus issues: -Onset: about 2 weeks ago - was feeling better from that,  but then got sick again 2-3 days ago in terms of his sinuses, was around someone sick last week and tested positive for covid in the last 24 hours -Symptoms include: started with scratchy throat and cough, then developed body aches, facial discomfort, upper teeth hurt -GFR  57 12/22 -Denies:fever, CP, SOB, NVD, body aches any further -Pertinent past medical history: see below, has not had covid before -Pertinent medication allergies: No Known Allergies -COVID-19 vaccine status: Immunization History  Administered Date(s) Administered   Influenza,inj,Quad PF,6+ Mos 11/10/2013   PFIZER(Purple Top)SARS-COV-2 Vaccination 04/09/2019, 05/09/2019, 11/20/2019, 06/24/2020      Past Medical History:  Diagnosis Date   Hyperlipidemia    Hypertension    Prostate cancer ([place])     Current Outpatient Medications on File Prior to Visit  Medication Sig Dispense Refill   amLODipine (NORVASC) 5 MG tablet Take 1 tablet (5 mg total) by mouth daily. 90 tablet 3   aspirin EC 81 MG tablet Take 81 mg by mouth daily.     atorvastatin (LIPITOR) 40 MG tablet Take 1 tablet (40 mg total) by mouth daily. 90 tablet 3   metoprolol succinate (TOPROL-XL) 50 MG 24 hr tablet Take with or  immediately following a meal. 90 tablet 3   No current facility-administered medications on file prior to visit.  Says he does not take the viagra. Willing to hold his lipitor x 8 days and take half dose of amlodipine while on the Paxlovid.   Observations/Objective: Patient sounds cheerful and well on the phone. I do not appreciate any SOB. Speech and thought processing are grossly intact. Patient reported vitals:  Assessment and Plan:  COVID-19   Query ongoing infection with possible sinusitis (he was concerned for this) vs VURI that was resolving and then new acute covid infection which started a few days agom vs other. New acute covid infection seems likely as he reports hanging out with someone sick last week.  Discussed treatment options and risk of drug interactions, ideal treatment window, potential complications, isolation and precautions for COVID-19. Checked for/reviewed any labs done in the last 90 days with GFR listed in HPI if available. After lengthy discussion, the patient opted for treatment with Paxlovid due to being higher risk for complications of covid or severe disease and other factors. Discussed EUA status of this drug and the fact that there is preliminary limited knowledge of risks/interactions/side effects per EUA document vs possible benefits and precautions. He agrees to hold his statin for 8 days, take one half dose of his norvasc while on paxlovid and avoid viargra use - reports no longer takes this medication. This information was shared with patient during the visit and also was provided in patient instructions. The patient did want a prescription for cough, Tessalon Rx sent.  Other symptomatic care measures summarized in patient instructions. Also nasal saline twice daily and in case of bacterial sinusitis sent doxy per his concerns to use if worsening or not improving as expected.  Advised to seek prompt virtual visit or in person care if worsening, new symptoms  arise, or if is not improving with treatment as expected per our conversation of expected course. Discussed options for follow up care. Did let this patient know that I do telemedicine on Tuesdays and Thursdays for [place] and those are the days I am logged into the system. Advised to schedule follow up visit with PCP, [place] virtual visits or [facility] if any further questions or concerns to avoid delays in care.   I discussed the assessment and treatment plan with the patient. The patient was provided an opportunity to ask questions and all were answered. The patient agreed with the plan and demonstrated an understanding of the instructions.    Follow Up Instructions:  I did not refer this patient for an OV with me in the next 24 hours for this/these issue(s).  I discussed the assessment and treatment plan with the patient. The patient was provided an opportunity to ask questions and all were answered. The patient agreed with the plan and demonstrated an understanding of the instructions.   I spent 20 minutes on the date of this visit in the care of this patient. See summary of tasks completed to properly care for this patient in the detailed notes above which also included counseling of above, review of PMH, medications, allergies, evaluation of the patient and ordering and/or  instructing patient on testing and care options.     Lucretia Kern, DO

## 2021-02-25 NOTE — Patient Instructions (Addendum)
HOME CARE TIPS:   -I sent the medication(s) we discussed to your pharmacy: Meds ordered this encounter  Medications   nirmatrelvir/ritonavir EUA, renal dosing, (PAXLOVID) 10 x 150 MG & 10 x 100MG TABS    Sig: Take 2 tablets by mouth 2 (two) times daily for 5 days. (Take nirmatrelvir 150 mg one tablet twice daily for 5 days and ritonavir 100 mg one tablet twice daily for 5 days) Patient GFR is 57    Dispense:  20 tablet    Refill:  0   doxycycline (VIBRA-TABS) 100 MG tablet    Sig: Take 1 tablet (100 mg total) by mouth 2 (two) times daily.    Dispense:  14 tablet    Refill:  0   benzonatate (TESSALON PERLES) 100 MG capsule    Sig: 1-2 capsules up to twice daily as needed for cough    Dispense:  30 capsule    Refill:  0   The Doxycycline is an antibiotic for a bacterial sinus infection - this can be taken if symptoms do not improve with nasal saline, the cough medication and the course of Paxlovid for Covid.   -I sent in the Covid19 treatment or referral you requested per our discussion. Please see the information provided below and discuss further with the pharmacist/treatment team.  -If taking Paxlovid, please review all medications, supplement and over the counter drugs with your pharmacist and ask them to check for any interactions. Please make the following changes to your regular medications while taking Paxlovid: *HOLD you Lipitor (atorvastatin) while taking Paxlovid and restart 3 days after finishing treatment *Take one half tablet of your Norvasc (amlodipine) while taking the Paxlovid *DO NOT take viragra while taking paxlovid  -there is a chance of rebound illness after finishing your treatment. If you become sick again please isolate for an additional 5 days, plus 5 more days of masking.   -can use tylenol if needed for fevers, aches and pains per instructions  -can use nasal saline a few times per day if you have nasal congestion  -stay hydrated, drink plenty of fluids and  eat small healthy meals - avoid dairy  -If the Covid test is positive, check out the [facility] website for more information on home care, transmission and treatment for COVID19  -follow up with your doctor in 2-3 days unless improving and feeling better  -stay home while sick, except to seek medical care. If you have COVID19, you will likely be contagious for 7-10 days. Flu or Influenza is likely contagious for about 7 days. Other respiratory viral infections remain contagious for 5-10+ days depending on the virus and many other factors. Wear a good mask that fits snugly (such as N95 or KN95) if around others to reduce the risk of transmission.  It was nice to meet you today, and I really hope you are feeling better soon. I help [place] out with telemedicine visits on Tuesdays and Thursdays and am happy to help if you need a follow up virtual visit on those days. Otherwise, if you have any concerns or questions following this visit please schedule a follow up visit with your Primary Care doctor or seek care at a local urgent care clinic to avoid delays in care.    Seek in person care or schedule a follow up video visit promptly if your symptoms worsen, new concerns arise or you are not improving with treatment. Call 911 and/or seek emergency care if your symptoms are severe or life  threatening.   FACT SHEET FOR PATIENTS, PARENTS, AND CAREGIVERS EMERGENCY USE AUTHORIZATION (EUA) OF PAXLOVID FOR CORONAVIRUS DISEASE 2019 (COVID-19) You are being given this Fact Sheet because your healthcare provider believes it is necessary to provide you with PAXLOVID for the treatment of mild-to-moderate coronavirus disease (COVID-19) caused by the SARS-CoV-2 virus. This Fact Sheet contains information to help you understand the risks and benefits of taking the PAXLOVID you have received or may receive. The U.S. Food and Drug Administration (FDA) has issued an Emergency Use Authorization (EUA) to make PAXLOVID  available during the COVID-19 pandemic (for more details about an EUA please see What is an Emergency Use Authorization? at the end of this document). PAXLOVID is not an FDA-approved medicine in the [place]. Read this Fact Sheet for information about PAXLOVID. Talk to your healthcare provider about your options or if you have any questions. It is your choice to take PAXLOVID.  What is COVID-19? COVID-19 is caused by a virus called a coronavirus. You can get COVID-19 through close contact with another person who has the virus. COVID-19 illnesses have ranged from very mild-to-severe, including illness resulting in death. While information so far suggests that most COVID-19 illness is mild, serious illness can happen and may cause some of your other medical conditions to become worse. Older people and people of all ages with severe, long lasting (chronic) medical conditions like heart disease, lung disease, and diabetes, for example seem to be at higher risk of being hospitalized for COVID-19.  What is PAXLOVID? PAXLOVID is an investigational medicine used to treat mild-to-moderate COVID-19 in adults and children [86 years of age and older weighing at least 78 pounds (48 kg)] with positive results of direct SARS-CoV-2 viral testing, and who are at high risk for progression to severe COVID-19, including hospitalization or death. PAXLOVID is investigational because it is still being studied. There is limited information about the safety and effectiveness of using PAXLOVID to treat people with mild-to-moderate COVID-19.  The FDA has authorized the emergency use of PAXLOVID for the treatment of mild-tomoderate COVID-19 in adults and children [83 years of age and older weighing at least 12 pounds (58 kg)] with a positive test for the virus that causes COVID-19, and who are at high risk for progression to severe COVID-19, including hospitalization or death, under an EUA. 1 Revised: 19 April 2020   What should I tell my healthcare provider before I take PAXLOVID? Tell your healthcare provider if you: ? Have any allergies ? Have liver or kidney disease ? Are pregnant or plan to become pregnant ? Are breastfeeding a child ? Have any serious illnesses  Tell your healthcare provider about all the medicines you take, including prescription and over-the-counter medicines, vitamins, and herbal supplements. Some medicines may interact with PAXLOVID and may cause serious side effects. Keep a list of your medicines to show your healthcare provider and pharmacist when you get a new medicine.  You can ask your healthcare provider or pharmacist for a list of medicines that interact with PAXLOVID. Do not start taking a new medicine without telling your healthcare provider. Your healthcare provider can tell you if it is safe to take PAXLOVID with other medicines.  Tell your healthcare provider if you are taking combined hormonal contraceptive. PAXLOVID may affect how your birth control pills work. Females who are able to become pregnant should use another effective alternative form of contraception or an additional barrier method of contraception. Talk to your healthcare provider if  you have any questions about contraceptive methods that might be right for you.  How do I take PAXLOVID? ? PAXLOVID consists of 2 medicines: nirmatrelvir and ritonavir. o Take 2 pink tablets of nirmatrelvir with 1 white tablet of ritonavir by mouth 2 times each day (in the morning and in the evening) for 5 days. For each dose, take all 3 tablets at the same time. o If you have kidney disease, talk to your healthcare provider. You may need a different dose. ? Swallow the tablets whole. Do not chew, break, or crush the tablets. ? Take PAXLOVID with or without food. ? Do not stop taking PAXLOVID without talking to your healthcare provider, even if you feel better. ? If you miss a dose of PAXLOVID  within 8 hours of the time it is usually taken, take it as soon as you remember. If you miss a dose by more than 8 hours, skip the missed dose and take the next dose at your regular time. Do not take 2 doses of PAXLOVID at the same time. ? If you take too much PAXLOVID, call your healthcare provider or go to the nearest hospital emergency room right away. ? If you are taking a ritonavir- or cobicistat-containing medicine to treat hepatitis C or Human Immunodeficiency Virus (HIV), you should continue to take your medicine as prescribed by your healthcare provider. 2 Revised: 19 April 2020    Talk to your healthcare provider if you do not feel better or if you feel worse after 5 days.  Who should generally not take PAXLOVID? Do not take PAXLOVID if: ? You are allergic to nirmatrelvir, ritonavir, or any of the ingredients in PAXLOVID. ? You are taking any of the following medicines: o Alfuzosin o Pethidine, propoxyphene o Ranolazine o Amiodarone, dronedarone, flecainide, propafenone, quinidine o Colchicine o Lurasidone, pimozide, clozapine o Dihydroergotamine, ergotamine, methylergonovine o Lovastatin, simvastatin o Sildenafil (Revatio) for pulmonary arterial hypertension (PAH) o Triazolam, oral midazolam o Apalutamide o Carbamazepine, phenobarbital, phenytoin o Rifampin o St. Johns Wort (hypericum perforatum) Taking PAXLOVID with these medicines may cause serious or life-threatening side effects or affect how PAXLOVID works.  These are not the only medicines that may cause serious side effects if taken with PAXLOVID. PAXLOVID may increase or decrease the levels of multiple other medicines. It is very important to tell your healthcare provider about all of the medicines you are taking because additional laboratory tests or changes in the dose of your other medicines may be necessary while you are taking PAXLOVID. Your healthcare provider may also tell you about specific  symptoms to watch out for that may indicate that you need to stop or decrease the dose of some of your other medicines.  What are the important possible side effects of PAXLOVID? Possible side effects of PAXLOVID are: ? Allergic Reactions. Allergic reactions can happen in people taking PAXLOVID, even after only 1 dose. Stop taking PAXLOVID and call your healthcare provider right away if you get any of the following symptoms of an allergic reaction: o hives o trouble swallowing or breathing o swelling of the mouth, lips, or face o throat tightness o hoarseness 3 Revised: 19 April 2020  o skin rash ? Liver Problems. Tell your healthcare provider right away if you have any of these signs and symptoms of liver problems: loss of appetite, yellowing of your skin and the whites of eyes (jaundice), dark-colored urine, pale colored stools and itchy skin, stomach area (abdominal) pain. ? Resistance to HIV Medicines. If  you have untreated HIV infection, PAXLOVID may lead to some HIV medicines not working as well in the future. ? Other possible side effects include: o altered sense of taste o diarrhea o high blood pressure o muscle aches These are not all the possible side effects of PAXLOVID. Not many people have taken PAXLOVID. Serious and unexpected side effects may happen. PAXLOVID is still being studied, so it is possible that all of the risks are not known at this time.  What other treatment choices are there? Veklury (remdesivir) is FDA-approved for the treatment of mild-to-moderate NIDPO-24 in certain adults and children. Talk with your doctor to see if Marijean Heath is appropriate for you. Like PAXLOVID, FDA may also allow for the emergency use of other medicines to treat people with COVID-19. Go to https://price.info/ for information on the emergency use of other medicines that are  authorized by FDA to treat people with COVID-19. Your healthcare provider may talk with you about clinical trials for which you may be eligible. It is your choice to be treated or not to be treated with PAXLOVID. Should you decide not to receive it or for your child not to receive it, it will not change your standard medical care.  What if I am pregnant or breastfeeding? There is no experience treating pregnant women or breastfeeding mothers with PAXLOVID. For a mother and unborn baby, the benefit of taking PAXLOVID may be greater than the risk from the treatment. If you are pregnant, discuss your options and specific situation with your healthcare provider. It is recommended that you use effective barrier contraception or do not have sexual activity while taking PAXLOVID. If you are breastfeeding, discuss your options and specific situation with your healthcare provider. 4 Revised: 19 April 2020   How do I report side effects with PAXLOVID? Contact your healthcare provider if you have any side effects that bother you or do not go away. Report side effects to FDA MedWatch at SmoothHits.hu or call 1-800-FDA1088 or you can report side effects to [employer]. at the contact information provided below. Website Fax number Telephone number www.pfizersafetyreporting.com [phone] How should I store [place]? Store PAXLOVID tablets at room temperature, between 68?F to 77?F (20?C to 25?C). How can I learn more about COVID-19? ? Ask your healthcare provider. ? Visit https://jacobson-johnson.com/. ? Contact your local or state public health department. What is an Emergency Use Authorization (EUA)? The [place] FDA has made PAXLOVID available under an emergency access mechanism called an Emergency Use Authorization (EUA). The EUA is supported by a Education officer, museum and Human Service (HHS) declaration that circumstances exist to justify the emergency use of drugs  and biological products during the COVID-19 pandemic. PAXLOVID for the treatment of mild-to-moderate COVID-19 in adults and children [23 years of age and older weighing at least 36 pounds (2 kg)] with positive results of direct SARS-CoV-2 viral testing, and who are at high risk for progression to severe COVID-19, including hospitalization or death, has not undergone the same type of review as an FDA-approved product. In issuing an EUA under the DTOIZ-12 public health emergency, the FDA has determined, among other things, that based on the total amount of scientific evidence available including data from adequate and well-controlled clinical trials, if available, it is reasonable to believe that the product may be effective for diagnosing, treating, or preventing COVID-19, or a serious or life-threatening disease or condition caused by COVID-19; that the known and potential benefits of the product, when used to diagnose,  treat, or prevent such disease or condition, outweigh the known and potential risks of such product; and that there are no adequate, approved, and available alternatives. All of these criteria must be met to allow for the product to be used in the treatment of patients during the COVID-19 pandemic. The EUA for PAXLOVID is in effect for the duration of the COVID-19 declaration justifying emergency use of this product, unless terminated or revoked (after which the products may no longer be used under the EUA). 5 Revised: 19 April 2020     Additional Information For general questions, visit the website or call the telephone number provided below. Website Telephone number www.COVID19oralRx.com [phone] (1-877-C19-PACK) You can also go to www.pfizermedinfo.com or call [phone] for more information. [id number] Revised: 19 April 2020

## 2021-02-27 ENCOUNTER — Telehealth: Payer: Self-pay | Admitting: Internal Medicine

## 2021-02-27 NOTE — Telephone Encounter (Signed)
Patient calling in  Had VV w/ Dr. Maudie Mercury 01/24.. patient was covid+ via at home test  Patient was prescribed nirmatrelvir/ritonavir EUA, renal dosing, (PAXLOVID) 10 x 150 MG & 10 x 100MG  TABS & doxycycline (VIBRA-TABS) 100 MG tablet  Says he has started taking the Paxlovid  (2 days) but is not feeling any better.. has not started the doxycycline but wants to know if it is okay to take both at the same time  Please call [phone]

## 2021-02-28 NOTE — Telephone Encounter (Signed)
Spoke with patient and he states that he is feeling much better than yesterday. Advised patient, that per Dr. Maudie Mercury, okay to take Doxycycline with Paxlovid if symptoms don't seem to be improving. Patient verbalizes understanding.

## 2021-05-16 DIAGNOSIS — C61 Malignant neoplasm of prostate: Secondary | ICD-10-CM | POA: Diagnosis not present

## 2021-05-23 DIAGNOSIS — C61 Malignant neoplasm of prostate: Secondary | ICD-10-CM | POA: Diagnosis not present

## 2021-09-23 NOTE — Progress Notes (Unsigned)
    Subjective:    Patient ID: Benjamin Wilkerson, male    DOB: [birth year], 76 y.o.   MRN: [id number]      HPI [place] is here for No chief complaint on file.        Medications and allergies reviewed with patient and updated if appropriate.  Current Outpatient Medications on File Prior to Visit  Medication Sig Dispense Refill   amLODipine (NORVASC) 5 MG tablet Take 1 tablet (5 mg total) by mouth daily. 90 tablet 3   aspirin EC 81 MG tablet Take 81 mg by mouth daily.     atorvastatin (LIPITOR) 40 MG tablet Take 1 tablet (40 mg total) by mouth daily. 90 tablet 3   benzonatate (TESSALON PERLES) 100 MG capsule 1-2 capsules up to twice daily as needed for cough 30 capsule 0   doxycycline (VIBRA-TABS) 100 MG tablet Take 1 tablet (100 mg total) by mouth 2 (two) times daily. 14 tablet 0   metoprolol succinate (TOPROL-XL) 50 MG 24 hr tablet Take with or immediately following a meal. 90 tablet 3   No current facility-administered medications on file prior to visit.    Review of Systems     Objective:  There were no vitals filed for this visit. BP Readings from Last 3 Encounters:  01/15/21 126/80  07/15/20 (!) 158/80  11/08/18 (!) 140/92   Wt Readings from Last 3 Encounters:  01/15/21 261 lb 6.4 oz (118.6 kg)  07/15/20 257 lb 15 oz (117 kg)  02/14/19 230 lb (104.3 kg)   There is no height or weight on file to calculate BMI.    Physical Exam         Assessment & Plan:    See Problem List for Assessment and Plan of chronic medical problems.

## 2021-09-24 ENCOUNTER — Encounter: Payer: Self-pay | Admitting: Internal Medicine

## 2021-09-24 ENCOUNTER — Ambulatory Visit (INDEPENDENT_AMBULATORY_CARE_PROVIDER_SITE_OTHER): Payer: Medicare Other | Admitting: Internal Medicine

## 2021-09-24 VITALS — BP 142/80 | HR 74 | Temp 98.4°F | Ht 70.0 in | Wt 255.0 lb

## 2021-09-24 DIAGNOSIS — J019 Acute sinusitis, unspecified: Secondary | ICD-10-CM | POA: Insufficient documentation

## 2021-09-24 DIAGNOSIS — I10 Essential (primary) hypertension: Secondary | ICD-10-CM | POA: Diagnosis not present

## 2021-09-24 DIAGNOSIS — R0981 Nasal congestion: Secondary | ICD-10-CM | POA: Diagnosis not present

## 2021-09-24 LAB — POC COVID19 BINAXNOW: SARS Coronavirus 2 Ag: NEGATIVE

## 2021-09-24 MED ORDER — PREDNISONE 20 MG PO TABS: 40.0000 mg | ORAL_TABLET | Freq: Every day | ORAL | 0 refills | Status: AC

## 2021-09-24 MED ORDER — AMOXICILLIN-POT CLAVULANATE 875-125 MG PO TABS: 1.0000 | ORAL_TABLET | Freq: Two times a day (BID) | ORAL | 0 refills | Status: AC

## 2021-09-24 MED ORDER — FLUTICASONE PROPIONATE 50 MCG/ACT NA SUSP: 2.0000 | Freq: Every day | NASAL | 6 refills | Status: DC

## 2021-09-24 NOTE — Assessment & Plan Note (Signed)
Chronic Blood pressure slightly elevated here today, but at his last visit his blood pressure was better controlled-12 6/80 No change in medications today-monitor only Continue amlodipine 5 mg daily, metoprolol XL 50 mg daily

## 2021-09-24 NOTE — Assessment & Plan Note (Addendum)
Acute ? Viral or bacterial  Will check for covid, which is negative Part of his issue is likely rebound congestion from using Zicam Also concern for bacterial infection Stop zicam nasal spray Start flonase 2 sprays daily Start prednisone 40 mg daily x 3 days for rebound congestion Start Augmentin 875-125 mg bid x 7 days Call if no improvement

## 2021-09-24 NOTE — Patient Instructions (Addendum)
    You were tested for covid today. It is negative.   Medications changes include :    stop zicam.   Start flonase nasal spray -2 sprays in each nostril daily.   Start prednisone 40 mg daily x 3 days.   Start Augmentin for your sinus infection.    Your prescription(s) have been sent to your pharmacy.      Return if symptoms worsen or fail to improve.

## 2021-11-12 DIAGNOSIS — C61 Malignant neoplasm of prostate: Secondary | ICD-10-CM | POA: Diagnosis not present

## 2021-11-19 DIAGNOSIS — C61 Malignant neoplasm of prostate: Secondary | ICD-10-CM | POA: Diagnosis not present

## 2021-12-09 ENCOUNTER — Ambulatory Visit (INDEPENDENT_AMBULATORY_CARE_PROVIDER_SITE_OTHER): Payer: Medicare Other

## 2021-12-09 VITALS — Ht 70.0 in | Wt 230.0 lb

## 2021-12-09 DIAGNOSIS — Z0000 Encounter for general adult medical examination without abnormal findings: Secondary | ICD-10-CM

## 2021-12-09 NOTE — Progress Notes (Signed)
Virtual Visit via Telephone Note  I connected with  Benjamin Wilkerson on 12/09/21 at  3:45 PM EST by telephone and verified that I am speaking with the correct person using two identifiers.  Location: Patient: Home Provider: [facility] Persons participating in the virtual visit: [place]   I discussed the limitations, risks, security and privacy concerns of performing an evaluation and management service by telephone and the availability of in person appointments. The patient expressed understanding and agreed to proceed.  Interactive audio and video telecommunications were attempted between this nurse and patient, however failed, due to patient having technical difficulties OR patient did not have access to video capability.  We continued and completed visit with audio only.  Some vital signs may be absent or patient reported.   Sheral Flow, LPN  Subjective:   Benjamin Wilkerson is a 76 y.o. Benjamin who presents for an Initial Medicare Annual Wellness Visit.  Review of Systems     Cardiac Risk Factors include: advanced age (>49mn, >>16women);dyslipidemia;hypertension;Benjamin gender;obesity (BMI >30kg/m2)     Objective:    Today's Vitals   12/09/21 1547  Weight: 230 lb (104.3 kg)  Height: '5\' 10"'$  (1.778 m)  PainSc: 0-No pain   Body mass index is 33 kg/m.     12/09/2021    3:49 PM 07/11/2019    9:30 AM 02/14/2019    2:47 PM  Advanced Directives  Does Patient Have a Medical Advance Directive? No No No  Would patient like information on creating a medical advance directive? No - Patient declined  Yes (MAU/Ambulatory/Procedural Areas - Information given)    Current Medications (verified) Outpatient Encounter Medications as of 12/09/2021  Medication Sig   amLODipine (NORVASC) 5 MG tablet Take 1 tablet (5 mg total) by mouth daily.   aspirin EC 81 MG tablet Take 81 mg by mouth daily.   atorvastatin (LIPITOR) 40 MG tablet Take 1 tablet (40 mg total) by  mouth daily.   fluticasone (FLONASE) 50 MCG/ACT nasal spray Place 2 sprays into both nostrils daily.   metoprolol succinate (TOPROL-XL) 50 MG 24 hr tablet Take with or immediately following a meal.   No facility-administered encounter medications on file as of 12/09/2021.    Allergies (verified) Patient has no known allergies.   History: Past Medical History:  Diagnosis Date   Hyperlipidemia    Hypertension    [place]    Past Surgical History:  Procedure Laterality Date   FRACTURE SURGERY     left wrist and arm   Family History  Problem Relation Age of Onset   Heart disease Mother        CAD/MI   Diabetes Mother    Heart disease Father        CAD/MI   Alcohol abuse Father    Cancer Neg Hx    COPD Neg Hx    Breast cancer Neg Hx    Colon cancer Neg Hx    Pancreatic cancer Neg Hx    Prostate cancer Neg Hx    Social History   Socioeconomic History   Marital status: Divorced    Spouse name: Not on file   Number of children: 2   Years of education: 16   Highest education level: Not on file  Occupational History   Occupation: EGeneticist, molecular   Comment: retired  Tobacco Use   Smoking status: Never   Smokeless tobacco: Never  Vaping Use   Vaping Use: Never used  Substance and Sexual Activity   Alcohol use: Yes    Alcohol/week: 7.0 - 14.0 standard drinks of alcohol    Types: 7 - 14 Standard drinks or equivalent per week   Drug use: No   Sexual activity: Yes    Partners: Female  Other Topics Concern   Not on file  Social History Narrative   HSG, COLLEGE. MARRIED 3 YEARS, DIVORCED,  MARRIED 91 YEARS - DIVORCED. LONG TERM MONOGAMOUS RELATIONSHIP. 2 DAUGHTERS  1970 ; 1979.  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    Social Determinants of Health   Financial Resource Strain: Low Risk  (12/09/2021)   Overall Financial Resource Strain (CARDIA)    Difficulty of Paying Living Expenses: Not hard at  all  Food Insecurity: No Food Insecurity (12/09/2021)   Hunger Vital Sign    Worried About Running Out of Food in the Last Year: Never true    Ran Out of Food in the Last Year: Never true  Transportation Needs: No Transportation Needs (12/09/2021)   PRAPARE - Hydrologist (Medical): No    Lack of Transportation (Non-Medical): No  Physical Activity: Inactive (12/09/2021)   Exercise Vital Sign    Days of Exercise per Week: 0 days    Minutes of Exercise per Session: 0 min  Stress: No Stress Concern Present (12/09/2021)   [place]    Feeling of Stress : Not at all  Social Connections: Moderately Integrated (12/09/2021)   Social Connection and Isolation Panel [NHANES]    Frequency of Communication with Friends and Family: More than three times a week    Frequency of Social Gatherings with Friends and Family: More than three times a week    Attends Religious Services: More than 4 times per year    Active Member of [employer] or Organizations: Yes    Attends Music therapist: More than 4 times per year    Marital Status: Divorced    Tobacco Counseling Counseling given: Not Answered   Clinical Intake:  Pre-visit preparation completed: Yes  Pain : No/denies pain Pain Score: 0-No pain     BMI - recorded: 33 Nutritional Status: BMI > 30  Obese Nutritional Risks: None Diabetes: No  How often do you need to have someone help you when you read instructions, pamphlets, or other written materials from your doctor or pharmacy?: 1 - Never What is the last grade level you completed in school?: HSG  Diabetic? no  Interpreter Needed?: No  Information entered by :: Lisette Abu, LPN.   Activities of Daily Living    12/09/2021    3:53 PM  In your present state of health, do you have any difficulty performing the following activities:  Hearing? 0  Vision? 0  Difficulty  concentrating or making decisions? 0  Walking or climbing stairs? 0  Dressing or bathing? 0  Doing errands, shopping? 0  Preparing Food and eating ? N  Using the Toilet? N  In the past six months, have you accidently leaked urine? N  Do you have problems with loss of bowel control? N  Managing your Medications? N  Managing your Finances? N  Housekeeping or managing your Housekeeping? N    Patient Care Team: [name], MD as PCP - General (Internal Medicine) Thalia Bloodgood, [place] as Referring Physician (Optometry)  Indicate any recent Medical Services you may have received from other than [facility] providers in the past  year (date may be approximate).     Assessment:   This is a routine wellness examination for Benjamin Wilkerson.  Hearing/Vision screen Hearing Screening - Comments:: Denies hearing difficulties   Vision Screening - Comments:: Wears rx glasses - up to date with routine eye exams with Thalia Bloodgood, OD.   Dietary issues and exercise activities discussed: Current Exercise Habits: The patient has a physically strenuous job, but has no regular exercise apart from work., Exercise limited by: None identified   Goals Addressed             This Visit's Progress    Client understands the importance of follow-up with providers by attending scheduled visits        Depression Screen    12/09/2021    3:51 PM 09/24/2021    8:50 AM 01/15/2021    4:08 PM 07/15/2020    3:42 PM 07/15/2020    3:25 PM 11/08/2018    3:14 PM 11/08/2018    3:05 PM  PHQ 2/9 Scores  PHQ - 2 Score 0 0 0 0 0 0 0  PHQ- 9 Score  0         Fall Risk    12/09/2021    3:50 PM 09/24/2021    8:49 AM 01/15/2021    4:08 PM 07/15/2020    3:42 PM 07/15/2020    3:25 PM  [place] in the past year? 0 0 0 0 0  Number falls in past yr: 0 0 0 0 0  Injury with Fall? 0 0 0 0 0  Risk for fall due to : No Fall Risks No Fall Risks     Follow up Falls prevention discussed Falls evaluation completed       FALL  RISK PREVENTION PERTAINING TO THE HOME:  Any stairs in or around the home? No  If so, are there any without handrails? No  Home free of loose throw rugs in walkways, pet beds, electrical cords, etc? Yes  Adequate lighting in your home to reduce risk of falls? Yes   ASSISTIVE DEVICES UTILIZED TO PREVENT FALLS:  Life alert? No  Use of a cane, walker or w/c? No  Grab bars in the bathroom? No  Shower chair or bench in shower? No  Elevated toilet seat or a handicapped toilet? No   TIMED UP AND GO:  Was the test performed? No . Phone Visit  Cognitive Function:        12/09/2021    3:58 PM  6CIT Screen  What Year? 0 points  What month? 0 points  What time? 0 points  Count back from 20 0 points  Months in reverse 0 points  Repeat phrase 0 points  Total Score 0 points    Immunizations Immunization History  Administered Date(s) Administered   Influenza,inj,Quad PF,6+ Mos 11/10/2013   PFIZER(Purple Top)SARS-COV-2 Vaccination 04/09/2019, 05/09/2019, 11/20/2019, 06/24/2020    TDAP status: Declined  Flu Vaccine status: Declined, Education has been provided regarding the importance of this vaccine but patient still declined. Advised may receive this vaccine at local pharmacy or Health Dept. Aware to provide a copy of the vaccination record if obtained from local pharmacy or Health Dept. Verbalized acceptance and understanding.  Pneumococcal vaccine status: Declined,  Education has been provided regarding the importance of this vaccine but patient still declined. Advised may receive this vaccine at local pharmacy or Health Dept. Aware to provide a copy of the vaccination record if obtained from local pharmacy or Health  Dept. Verbalized acceptance and understanding.   Covid-19 vaccine status: Completed vaccines  Qualifies for Shingles Vaccine? Yes   Zostavax completed No   Shingrix Completed?: No.    Education has been provided regarding the importance of this vaccine. Patient has  been advised to call insurance company to determine out of pocket expense if they have not yet received this vaccine. Advised may also receive vaccine at local pharmacy or Health Dept. Verbalized acceptance and understanding.  Screening Tests Health Maintenance  Topic Date Due   TETANUS/TDAP  Never done   Zoster Vaccines- Shingrix (1 of 2) Never done   COLONOSCOPY (Pts 45-38yr Insurance coverage will need to be confirmed)  Never done   COVID-19 Vaccine (5 - Pfizer risk series) 08/19/2020   Pneumonia Vaccine 74 Years old (1 - PCV) 01/15/2022 (Originally 07/21/2012)   INFLUENZA VACCINE  05/03/2022 (Originally 09/02/2021)   Medicare Annual Wellness (AWV)  12/10/2022   Hepatitis C Screening  Completed   HPV VACCINES  Aged Out    Health Maintenance  Health Maintenance Due  Topic Date Due   TETANUS/TDAP  Never done   Zoster Vaccines- Shingrix (1 of 2) Never done   COLONOSCOPY (Pts 45-488yrInsurance coverage will need to be confirmed)  Never done   COVID-19 Vaccine (5 - Pfizer risk series) 08/19/2020    Colorectal cancer screening: No longer required.  Patient declined.  Lung Cancer Screening: (Low Dose CT Chest recommended if Age 74-80ears, 30 pack-year currently smoking OR have quit w/in 15years.) does not qualify.   Lung Cancer Screening Referral: no  Additional Screening:  Hepatitis C Screening: does qualify; Completed 06/16/2017  Vision Screening: Recommended annual ophthalmology exams for early detection of glaucoma and other disorders of the eye. Is the patient up to date with their annual eye exam?  No  Who is the provider or what is the name of the office in which the patient attends annual eye exams? Last done by Dr. WiThalia Bloodgoodf pt is not established with a provider, would they like to be referred to a provider to establish care? No .   Dental Screening: Recommended annual dental exams for proper oral hygiene  Community Resource Referral / Chronic Care  Management: CRR required this visit?  No   CCM required this visit?  No      Plan:     I have personally reviewed and noted the following in the patient's chart:   Medical and social history Use of alcohol, tobacco or illicit drugs  Current medications and supplements including opioid prescriptions. Patient is not currently taking opioid prescriptions. Functional ability and status Nutritional status Physical activity Advanced directives List of other physicians Hospitalizations, surgeries, and ER visits in previous 12 months Vitals Screenings to include cognitive, depression, and falls Referrals and appointments  In addition, I have reviewed and discussed with patient certain preventive protocols, quality metrics, and best practice recommendations. A written personalized care plan for preventive services as well as general preventive health recommendations were provided to patient.     ShSheral FlowLPN   [id number] Nurse Notes: N/A

## 2021-12-09 NOTE — Patient Instructions (Signed)
Benjamin Wilkerson , Thank you for taking time to come for your Medicare Wellness Visit. I appreciate your ongoing commitment to your health goals. Please review the following plan we discussed and let me know if I can assist you in the future.   These are the goals we discussed:  Goals      Client understands the importance of follow-up with providers by attending scheduled visits        This is a list of the screening recommended for you and due dates:  Health Maintenance  Topic Date Due   Tetanus Vaccine  Never done   Zoster (Shingles) Vaccine (1 of 2) Never done   Colon Cancer Screening  Never done   COVID-19 Vaccine (5 - Pfizer risk series) 08/19/2020   Pneumonia Vaccine (1 - PCV) 01/15/2022*   Flu Shot  05/03/2022*   Medicare Annual Wellness Visit  12/10/2022   Hepatitis C Screening: USPSTF Recommendation to screen - Ages 18-79 yo.  Completed   HPV Vaccine  Aged Out  *Topic was postponed. The date shown is not the original due date.    Advanced directives: no  Conditions/risks identified: yes  Next appointment: Follow up in one year for your annual wellness visit.   Preventive Care 61 Years and Older, Male  Preventive care refers to lifestyle choices and visits with your health care provider that can promote health and wellness. What does preventive care include? A yearly physical exam. This is also called an annual well check. Dental exams once or twice a year. Routine eye exams. Ask your health care provider how often you should have your eyes checked. Personal lifestyle choices, including: Daily care of your teeth and gums. Regular physical activity. Eating a healthy diet. Avoiding tobacco and drug use. Limiting alcohol use. Practicing safe sex. Taking low doses of aspirin every day. Taking vitamin and mineral supplements as recommended by your health care provider. What happens during an annual well check? The services and screenings done by your health care provider  during your annual well check will depend on your age, overall health, lifestyle risk factors, and family history of disease. Counseling  Your health care provider may ask you questions about your: Alcohol use. Tobacco use. Drug use. Emotional well-being. Home and relationship well-being. Sexual activity. Eating habits. History of falls. Memory and ability to understand (cognition). Work and work Statistician. Screening  You may have the following tests or measurements: Height, weight, and BMI. Blood pressure. Lipid and cholesterol levels. These may be checked every 5 years, or more frequently if you are over 80 years old. Skin check. Lung cancer screening. You may have this screening every year starting at age 20 if you have a 30-pack-year history of smoking and currently smoke or have quit within the past 15 years. Fecal occult blood test (FOBT) of the stool. You may have this test every year starting at age 66. Flexible sigmoidoscopy or colonoscopy. You may have a sigmoidoscopy every 5 years or a colonoscopy every 10 years starting at age 25. Prostate cancer screening. Recommendations will vary depending on your family history and other risks. Hepatitis C blood test. Hepatitis B blood test. Sexually transmitted disease (STD) testing. Diabetes screening. This is done by checking your blood sugar (glucose) after you have not eaten for a while (fasting). You may have this done every 1-3 years. Abdominal aortic aneurysm (AAA) screening. You may need this if you are a current or former smoker. Osteoporosis. You may be screened starting at  age 34 if you are at high risk. Talk with your health care provider about your test results, treatment options, and if necessary, the need for more tests. Vaccines  Your health care provider may recommend certain vaccines, such as: Influenza vaccine. This is recommended every year. Tetanus, diphtheria, and acellular pertussis (Tdap, Td) vaccine. You  may need a Td booster every 10 years. Zoster vaccine. You may need this after age 27. Pneumococcal 13-valent conjugate (PCV13) vaccine. One dose is recommended after age 67. Pneumococcal polysaccharide (PPSV23) vaccine. One dose is recommended after age 84. Talk to your health care provider about which screenings and vaccines you need and how often you need them. This information is not intended to replace advice given to you by your health care provider. Make sure you discuss any questions you have with your health care provider. Document Released: 02/15/2015 Document Revised: 10/09/2015 Document Reviewed: 11/20/2014 Elsevier Interactive Patient Education  2017 [place] Prevention in the Home Falls can cause injuries. They can happen to people of all ages. There are many things you can do to make your home safe and to help prevent falls. What can I do on the outside of my home? Regularly fix the edges of walkways and driveways and fix any cracks. Remove anything that might make you trip as you walk through a door, such as a raised step or threshold. Trim any bushes or trees on the path to your home. Use bright outdoor lighting. Clear any walking paths of anything that might make someone trip, such as rocks or tools. Regularly check to see if handrails are loose or broken. Make sure that both sides of any steps have handrails. Any raised decks and porches should have guardrails on the edges. Have any leaves, snow, or ice cleared regularly. Use sand or salt on walking paths during winter. Clean up any spills in your garage right away. This includes oil or grease spills. What can I do in the bathroom? Use night lights. Install grab bars by the toilet and in the tub and shower. Do not use towel bars as grab bars. Use non-skid mats or decals in the tub or shower. If you need to sit down in the shower, use a plastic, non-slip stool. Keep the floor dry. Clean up any water that spills  on the floor as soon as it happens. Remove soap buildup in the tub or shower regularly. Attach bath mats securely with double-sided non-slip rug tape. Do not have throw rugs and other things on the floor that can make you trip. What can I do in the bedroom? Use night lights. Make sure that you have a light by your bed that is easy to reach. Do not use any sheets or blankets that are too big for your bed. They should not hang down onto the floor. Have a firm chair that has side arms. You can use this for support while you get dressed. Do not have throw rugs and other things on the floor that can make you trip. What can I do in the kitchen? Clean up any spills right away. Avoid walking on wet floors. Keep items that you use a lot in easy-to-reach places. If you need to reach something above you, use a strong step stool that has a grab bar. Keep electrical cords out of the way. Do not use floor polish or wax that makes floors slippery. If you must use wax, use non-skid floor wax. Do not have throw rugs and  other things on the floor that can make you trip. What can I do with my stairs? Do not leave any items on the stairs. Make sure that there are handrails on both sides of the stairs and use them. Fix handrails that are broken or loose. Make sure that handrails are as long as the stairways. Check any carpeting to make sure that it is firmly attached to the stairs. Fix any carpet that is loose or worn. Avoid having throw rugs at the top or bottom of the stairs. If you do have throw rugs, attach them to the floor with carpet tape. Make sure that you have a light switch at the top of the stairs and the bottom of the stairs. If you do not have them, ask someone to add them for you. What else can I do to help prevent falls? Wear shoes that: Do not have high heels. Have rubber bottoms. Are comfortable and fit you well. Are closed at the toe. Do not wear sandals. If you use a stepladder: Make  sure that it is fully opened. Do not climb a closed stepladder. Make sure that both sides of the stepladder are locked into place. Ask someone to hold it for you, if possible. Clearly mark and make sure that you can see: Any grab bars or handrails. First and last steps. Where the edge of each step is. Use tools that help you move around (mobility aids) if they are needed. These include: Canes. Walkers. Scooters. Crutches. Turn on the lights when you go into a dark area. Replace any light bulbs as soon as they burn out. Set up your furniture so you have a clear path. Avoid moving your furniture around. If any of your floors are uneven, fix them. If there are any pets around you, be aware of where they are. Review your medicines with your doctor. Some medicines can make you feel dizzy. This can increase your chance of falling. Ask your doctor what other things that you can do to help prevent falls. This information is not intended to replace advice given to you by your health care provider. Make sure you discuss any questions you have with your health care provider. Document Released: 11/15/2008 Document Revised: 06/27/2015 Document Reviewed: 02/23/2014 Elsevier Interactive Patient Education  2017 [employer].

## 2021-12-16 DIAGNOSIS — G454 Transient global amnesia: Secondary | ICD-10-CM | POA: Diagnosis not present

## 2021-12-16 DIAGNOSIS — R404 Transient alteration of awareness: Secondary | ICD-10-CM | POA: Diagnosis not present

## 2021-12-16 DIAGNOSIS — Z923 Personal history of irradiation: Secondary | ICD-10-CM | POA: Diagnosis not present

## 2021-12-16 DIAGNOSIS — E785 Hyperlipidemia, unspecified: Secondary | ICD-10-CM | POA: Diagnosis not present

## 2021-12-16 DIAGNOSIS — Z79899 Other long term (current) drug therapy: Secondary | ICD-10-CM | POA: Diagnosis not present

## 2021-12-16 DIAGNOSIS — R0689 Other abnormalities of breathing: Secondary | ICD-10-CM | POA: Diagnosis not present

## 2021-12-16 DIAGNOSIS — R410 Disorientation, unspecified: Secondary | ICD-10-CM | POA: Diagnosis not present

## 2021-12-16 DIAGNOSIS — C61 Malignant neoplasm of prostate: Secondary | ICD-10-CM | POA: Diagnosis not present

## 2021-12-16 DIAGNOSIS — I10 Essential (primary) hypertension: Secondary | ICD-10-CM | POA: Diagnosis not present

## 2021-12-16 DIAGNOSIS — R4182 Altered mental status, unspecified: Secondary | ICD-10-CM | POA: Diagnosis not present

## 2021-12-16 DIAGNOSIS — I959 Hypotension, unspecified: Secondary | ICD-10-CM | POA: Diagnosis not present

## 2021-12-16 DIAGNOSIS — I440 Atrioventricular block, first degree: Secondary | ICD-10-CM | POA: Diagnosis not present

## 2021-12-17 DIAGNOSIS — I440 Atrioventricular block, first degree: Secondary | ICD-10-CM | POA: Diagnosis not present

## 2021-12-17 DIAGNOSIS — R413 Other amnesia: Secondary | ICD-10-CM | POA: Diagnosis not present

## 2021-12-18 ENCOUNTER — Telehealth: Payer: Self-pay

## 2021-12-18 NOTE — Telephone Encounter (Signed)
Transition Care Management Follow-up Telephone Call Date of discharge and from where: [facility] 12-17-21 Dx: [facility]  How have you been since you were released from the hospital? Feeling better  Any questions or concerns? No  Items Reviewed: Did the pt receive and understand the discharge instructions provided? Yes  Medications obtained and verified? Yes  Other? No  Any new allergies since your discharge? No  Dietary orders reviewed? Yes Do you have support at home? Yes   Home Care and Equipment/Supplies: Were home health services ordered? no If so, what is the name of the agency? na  Has the agency set up a time to come to the patient's home? not applicable Were any new equipment or medical supplies ordered?  No What is the name of the medical supply agency? na Were you able to get the supplies/equipment? not applicable Do you have any questions related to the use of the equipment or supplies? No  Functional Questionnaire: (I = Independent and D = Dependent) ADLs: I  Bathing/Dressing- I  Meal Prep- I  Eating- I  Maintaining continence- I  Transferring/Ambulation- I  Managing Meds- I  Follow up appointments reviewed:  PCP Hospital f/u appt confirmed? Yes  Scheduled to see Dr Jenny Reichmann on 12-29-21 @ [facility] f/u appt confirmed? No  . Are transportation arrangements needed? No  If their condition worsens, is the pt aware to call PCP or go to the Emergency Dept.? Yes Was the patient provided with contact information for the PCP's office or ED? Yes Was to pt encouraged to call back with questions or concerns? Yes   Juanda Crumble LPN [place] Direct Dial [phone]

## 2021-12-29 ENCOUNTER — Encounter: Payer: Self-pay | Admitting: Internal Medicine

## 2021-12-29 ENCOUNTER — Ambulatory Visit (INDEPENDENT_AMBULATORY_CARE_PROVIDER_SITE_OTHER): Payer: Medicare Other | Admitting: Internal Medicine

## 2021-12-29 VITALS — BP 138/76 | HR 60 | Temp 98.1°F | Ht 70.0 in | Wt 252.0 lb

## 2021-12-29 DIAGNOSIS — G454 Transient global amnesia: Secondary | ICD-10-CM | POA: Diagnosis not present

## 2021-12-29 DIAGNOSIS — R9431 Abnormal electrocardiogram [ECG] [EKG]: Secondary | ICD-10-CM

## 2021-12-29 DIAGNOSIS — E538 Deficiency of other specified B group vitamins: Secondary | ICD-10-CM

## 2021-12-29 DIAGNOSIS — E7800 Pure hypercholesterolemia, unspecified: Secondary | ICD-10-CM | POA: Diagnosis not present

## 2021-12-29 DIAGNOSIS — I10 Essential (primary) hypertension: Secondary | ICD-10-CM | POA: Diagnosis not present

## 2021-12-29 MED ORDER — ATORVASTATIN CALCIUM 80 MG PO TABS: 80.0000 mg | ORAL_TABLET | Freq: Every day | ORAL | 3 refills | Status: DC

## 2021-12-29 NOTE — Assessment & Plan Note (Signed)
Low uncontrolled, for start B12 1000 mcg qd for 6 mo

## 2021-12-29 NOTE — Assessment & Plan Note (Signed)
Recent LDL chol 116 - uncontrolled - for increased lipitor 80 mg qd

## 2021-12-29 NOTE — Assessment & Plan Note (Signed)
BP Readings from Last 3 Encounters:  12/29/21 138/76  09/24/21 (!) 142/80  01/15/21 126/80   Stable, pt to continue medical treatment norvasc 5 mg qd, toprol xl 50 mg

## 2021-12-29 NOTE — Assessment & Plan Note (Signed)
Overall stable though not resolved, no further imaging or evaluation needed at this itme,  to f/u any worsening symptoms or concerns

## 2021-12-29 NOTE — Progress Notes (Signed)
Patient ID: Benjamin Wilkerson, male   DOB: 06-03-1945, 76 y.o.   MRN: [id number]

## 2021-12-29 NOTE — Progress Notes (Signed)
Patient ID: Benjamin Wilkerson, male   DOB: 1945-02-06, 76 y.o.   MRN: [id number]        Chief Complaint: follow up TGA       HPI:  Benjamin Wilkerson is a 76 y.o. male here with c/o Patient presented Dec 16, 2021 to [facility] ED after acute disorientation and confusion as to where he was after he returned home from work, repeatedly questioning where he was without any memory of his work day per his partner. He continued to remain in disoriented state when brought to the ED. He had no other focal neurologic deficits on exam, CT head and subsequent MRI head were negative for any structural abnormalities or findings of stroke. There were no lab abnormalities, Vitamin B12 level in the 200s, no hx of head trauma/injury. Patient returned to his baseline within 24 hours and was alert and oriented x 4 now without any signs of disorientation on repeat evaluation. Presentation likely secondary to benign transient amnesia. Pt then for ED discharge with recommendation for B12 supplement.  Since that time has had no further memory issue, though the memory of that time still has not returned.  Labs there indicate LDL 116 on lipitor 40 mg qdPt willing for Cardiac CT score.  Due for shingrix, declines colonoscopy       Wt Readings from Last 3 Encounters:  12/29/21 252 lb (114.3 kg)  12/09/21 230 lb (104.3 kg)  09/24/21 255 lb (115.7 kg)   BP Readings from Last 3 Encounters:  12/29/21 138/76  09/24/21 (!) 142/80  01/15/21 126/80         Past Medical History:  Diagnosis Date   Hyperlipidemia    Hypertension    Prostate cancer [facility])    Past Surgical History:  Procedure Laterality Date   FRACTURE SURGERY     left wrist and arm    reports that he has never smoked. He has never used smokeless tobacco. He reports current alcohol use of about 7.0 - 14.0 standard drinks of alcohol per week. He reports that he does not use drugs. family history includes Alcohol abuse in his father; Diabetes in his mother; Heart  disease in his father and mother. No Known Allergies Current Outpatient Medications on File Prior to Visit  Medication Sig Dispense Refill   amLODipine (NORVASC) 5 MG tablet Take 1 tablet (5 mg total) by mouth daily. 90 tablet 3   aspirin EC 81 MG tablet Take 81 mg by mouth daily.     cholecalciferol (VITAMIN D3) 25 MCG (1000 UNIT) tablet Take 1,000 Units by mouth daily.     fluticasone (FLONASE) 50 MCG/ACT nasal spray Place 2 sprays into both nostrils daily. 16 g 6   metoprolol succinate (TOPROL-XL) 50 MG 24 hr tablet Take with or immediately following a meal. 90 tablet 3   No current facility-administered medications on file prior to visit.        ROS:  All others reviewed and negative.  Objective        PE:  BP 138/76 (BP Location: Left Arm, Patient Position: Sitting, Cuff Size: Large)   Pulse 60   Temp 98.1 F (36.7 C) (Oral)   Ht '5\' 10"'$  (1.778 m)   Wt 252 lb (114.3 kg)   SpO2 96%   BMI 36.16 kg/m                 Constitutional: Pt appears in NAD  HENT: Head: NCAT.                Right Ear: External ear normal.                 Left Ear: External ear normal.                Eyes: . Pupils are equal, round, and reactive to light. Conjunctivae and EOM are normal               Nose: without d/c or deformity               Neck: Neck supple. Gross normal ROM               Cardiovascular: Normal rate and regular rhythm.                 Pulmonary/Chest: Effort normal and breath sounds without rales or wheezing.                Abd:  Soft, NT, ND, + BS, no organomegaly               Neurological: Pt is alert. At baseline orientation, motor grossly intact               Skin: Skin is warm. No rashes, no other new lesions, LE edema - none               Psychiatric: Pt behavior is normal without agitation   Micro: none  Cardiac tracings I have personally interpreted today:  none  Pertinent Radiological findings (summarize): none   Lab Results  Component Value Date    WBC 8.7 01/15/2021   HGB 15.7 01/15/2021   HCT 46.8 01/15/2021   PLT 216.0 01/15/2021   GLUCOSE 79 01/15/2021   CHOL 198 01/15/2021   TRIG 179.0 (H) 01/15/2021   HDL 39.50 01/15/2021   LDLDIRECT 187.8 02/04/2009   LDLCALC 122 (H) 01/15/2021   ALT 25 01/15/2021   AST 16 01/15/2021   NA 139 01/15/2021   K 4.5 01/15/2021   CL 103 01/15/2021   CREATININE 1.24 01/15/2021   BUN 17 01/15/2021   CO2 28 01/15/2021   TSH 3.30 01/15/2021   PSA 0.19 01/15/2021   HGBA1C 6.2 01/15/2021   Assessment/Plan:  Benjamin Wilkerson is a 76 y.o. White or Caucasian [1] male with  has a past medical history of Hyperlipidemia, Hypertension, and Prostate cancer ([place]).  TGA (transient global amnesia) Overall stable though not resolved, no further imaging or evaluation needed at this itme,  to f/u any worsening symptoms or concerns  Essential hypertension BP Readings from Last 3 Encounters:  12/29/21 138/76  09/24/21 (!) 142/80  01/15/21 126/80   Stable, pt to continue medical treatment norvasc 5 mg qd, toprol xl 50 mg   HLD (hyperlipidemia) Recent LDL chol 116 - uncontrolled - for increased lipitor 80 mg qd  B12 deficiency Low uncontrolled, for start B12 1000 mcg qd for 6 mo  Followup: Return in about 6 months (around 06/29/2022).  Cathlean Cower, MD 12/29/2021 12:47 PM [facility]

## 2021-12-29 NOTE — Patient Instructions (Addendum)
Please start OTC B12 1000 mcg per day for 6 months  Please have your Shingrix (shingles) shots done at your local pharmacy.  Ok to increase the lipitor to 80 mg per day  Please continue all other medications as before, and refills have been done if requested.  Please have the pharmacy call with any other refills you may need.  Please call if you change your mind about the colonoscopy  Please keep your appointments with your specialists as you may have planned  You will be contacted regarding the referral for: Cardiac CT score  Please make an Appointment to return in 6 months, or sooner if needed

## 2022-01-13 ENCOUNTER — Ambulatory Visit (HOSPITAL_COMMUNITY)
Admission: RE | Admit: 2022-01-13 | Discharge: 2022-01-13 | Disposition: A | Discharge status: Home / Self Care | Payer: Self-pay | Source: Ambulatory Visit | Attending: Internal Medicine | Admitting: Internal Medicine

## 2022-01-13 DIAGNOSIS — I10 Essential (primary) hypertension: Secondary | ICD-10-CM | POA: Insufficient documentation

## 2022-01-13 DIAGNOSIS — R9431 Abnormal electrocardiogram [ECG] [EKG]: Secondary | ICD-10-CM | POA: Insufficient documentation

## 2022-01-13 DIAGNOSIS — E7800 Pure hypercholesterolemia, unspecified: Secondary | ICD-10-CM | POA: Insufficient documentation

## 2022-01-14 ENCOUNTER — Encounter: Payer: Self-pay | Admitting: Internal Medicine

## 2022-01-14 ENCOUNTER — Other Ambulatory Visit: Payer: Self-pay | Admitting: Internal Medicine

## 2022-01-14 ENCOUNTER — Telehealth: Payer: Self-pay | Admitting: Internal Medicine

## 2022-01-14 DIAGNOSIS — R931 Abnormal findings on diagnostic imaging of heart and coronary circulation: Secondary | ICD-10-CM

## 2022-01-14 NOTE — Telephone Encounter (Signed)
Spoke with patient regarding results, recommended referral to cardiology and meds. Patient expressed understanding

## 2022-01-14 NOTE — Telephone Encounter (Signed)
Patient called and said a voicemail was left for him and he was calling back. Patient can be reached at [phone].

## 2022-01-20 ENCOUNTER — Ambulatory Visit: Payer: Medicare Other | Admitting: Internal Medicine

## 2022-01-25 ENCOUNTER — Other Ambulatory Visit: Payer: Self-pay | Admitting: Internal Medicine

## 2022-01-25 DIAGNOSIS — I10 Essential (primary) hypertension: Secondary | ICD-10-CM

## 2022-02-13 ENCOUNTER — Ambulatory Visit: Payer: Medicare Other | Admitting: Cardiovascular Disease

## 2022-03-06 ENCOUNTER — Ambulatory Visit: Payer: Medicare Other | Attending: Cardiovascular Disease | Admitting: Interventional Cardiology

## 2022-03-06 ENCOUNTER — Encounter: Payer: Self-pay | Admitting: *Deleted

## 2022-03-06 ENCOUNTER — Encounter: Payer: Self-pay | Admitting: Interventional Cardiology

## 2022-03-06 VITALS — BP 138/72 | HR 56 | Ht 70.0 in | Wt 246.0 lb

## 2022-03-06 DIAGNOSIS — I10 Essential (primary) hypertension: Secondary | ICD-10-CM | POA: Insufficient documentation

## 2022-03-06 DIAGNOSIS — I2584 Coronary atherosclerosis due to calcified coronary lesion: Secondary | ICD-10-CM

## 2022-03-06 DIAGNOSIS — R0609 Other forms of dyspnea: Secondary | ICD-10-CM | POA: Diagnosis not present

## 2022-03-06 DIAGNOSIS — I2510 Atherosclerotic heart disease of native coronary artery without angina pectoris: Secondary | ICD-10-CM | POA: Insufficient documentation

## 2022-03-06 DIAGNOSIS — E782 Mixed hyperlipidemia: Secondary | ICD-10-CM

## 2022-03-06 NOTE — Patient Instructions (Signed)
Medication Instructions:  Your physician recommends that you continue on your current medications as directed. Please refer to the Current Medication list given to you today.  *If you need a refill on your cardiac medications before your next appointment, please call your pharmacy*   Lab Work: none If you have labs (blood work) drawn today and your tests are completely normal, you will receive your results only by: MyChart Message (if you have MyChart) OR A paper copy in the mail If you have any lab test that is abnormal or we need to change your treatment, we will call you to review the results.   Testing/Procedures: Your physician has requested that you have an exercise stress myoview. For further information please visit [email or web address]. Please follow instruction sheet, as given.    Follow-Up: At [facility], you and your health needs are our priority.  As part of our continuing mission to provide you with exceptional heart care, we have created designated Provider Care Teams.  These Care Teams include your primary Cardiologist (physician) and Advanced Practice Providers (APPs -  Physician Assistants and Nurse Practitioners) who all work together to provide you with the care you need, when you need it.  We recommend signing up for the patient portal called "MyChart".  Sign up information is provided on this After Visit Summary.  MyChart is used to connect with patients for Virtual Visits (Telemedicine).  Patients are able to view lab/test results, encounter notes, upcoming appointments, etc.  Non-urgent messages can be sent to your provider as well.   To learn more about what you can do with MyChart, go to [email or web address].    Your next appointment:   12 month(s)  Provider:   Jayadeep Varanasi, MD     Other Instructions    

## 2022-03-06 NOTE — Progress Notes (Signed)
Cardiology Office Note   Date:  03/06/2022   ID:  Carrick, Rijos August 13, 1945, MRN [id number]  PCP:  [name], MD    No chief complaint on file.  Coronary artery calcification  Wt Readings from Last 3 Encounters:  03/06/22 246 lb (111.6 kg)  12/29/21 252 lb (114.3 kg)  12/09/21 230 lb (104.3 kg)       History of Present Illness: Benjamin Wilkerson is a 77 y.o. male who is being seen today for the evaluation of elevated coronary calcium score at the request of [name], MD.   Calcium scoring CT in December 2023 showed: "Coronary Calcium Score:   Left main: 16.2   Left anterior descending artery: 183   Left circumflex artery: 50.1   Right coronary artery: 634   Total: 884   Percentile: 77th   Pericardium: Normal.   Non-cardiac: See separate report from [facility].   IMPRESSION: 1. Coronary calcium score of 884. This was 77th percentile for age-, race-, and sex-matched controls."  Hospitalized in 11/23 at [facility] regional with confusion.  Transient global amnesia.  No further episodes.  Reports some DOE.    Younger brother had a stent.   Past Medical History:  Diagnosis Date   Hyperlipidemia    Hypertension    Prostate cancer [facility])     Past Surgical History:  Procedure Laterality Date   FRACTURE SURGERY     left wrist and arm     Current Outpatient Medications  Medication Sig Dispense Refill   aspirin EC 81 MG tablet Take 81 mg by mouth daily.     atorvastatin (LIPITOR) 80 MG tablet Take 1 tablet (80 mg total) by mouth daily. 90 tablet 3   cholecalciferol (VITAMIN D3) 25 MCG (1000 UNIT) tablet Take 1,000 Units by mouth daily.     fluticasone (FLONASE) 50 MCG/ACT nasal spray Place 2 sprays into both nostrils daily. 16 g 6   metoprolol succinate (TOPROL-XL) 50 MG 24 hr tablet TAKE 1 TABLET DAILY WITH OR IMMEDIATELY FOLLOWING A MEAL AS DIRECTED 90 tablet 3   amLODipine (NORVASC) 5 MG tablet Take 1 tablet (5 mg total) by  mouth daily. 90 tablet 3   No current facility-administered medications for this visit.    Allergies:   Patient has no known allergies.    Social History:  The patient  reports that he has never smoked. He has never used smokeless tobacco. He reports current alcohol use of about 7.0 - 14.0 standard drinks of alcohol per week. He reports that he does not use drugs.   Family History:  The patient's family history includes Alcohol abuse in his father; Diabetes in his mother; Heart disease in his father and mother.    ROS:  Please see the history of present illness.   Otherwise, review of systems are positive for DOE.   All other systems are reviewed and negative.    PHYSICAL EXAM: VS:  BP 138/72   Pulse (!) 56   Ht '5\' 10"'$  (1.778 m)   Wt 246 lb (111.6 kg)   SpO2 96%   BMI 35.30 kg/m  , BMI Body mass index is 35.3 kg/m. GEN: Well nourished, well developed, in no acute distress HEENT: normal Neck: no JVD, carotid bruits, or masses Cardiac: RRR; no murmurs, rubs, or gallops,no edema  Respiratory:  clear to auscultation bilaterally, normal work of breathing GI: soft, nontender, nondistended, + BS, obese , umbilical hernia MS: no deformity or  atrophy Skin: warm and dry, no rash Neuro:  Strength and sensation are intact Psych: euthymic mood, full affect   EKG:   The ekg ordered today demonstrates sinus bradycardia , prolonged PR interval   Recent Labs: No results found for requested labs within last 365 days.   Lipid Panel    Component Value Date/Time   CHOL 198 01/15/2021 1623   TRIG 179.0 (H) 01/15/2021 1623   HDL 39.50 01/15/2021 1623   CHOLHDL 5 01/15/2021 1623   VLDL 35.8 01/15/2021 1623   LDLCALC 122 (H) 01/15/2021 1623   LDLDIRECT 187.8 02/04/2009 0926     Other studies Reviewed: Additional studies/ records that were reviewed today with results demonstrating: Lab results reviewed.   ASSESSMENT AND PLAN:  Coronary artery calcium score elevated.  Plan for  exercise Myoview given dyspnea on exertion.  This could be an anginal equivalent.  His younger brothers had a stent.  Continue aspirin.  Continue high-dose statin.  Blood pressure has been controlled at his doctors visits.  He does not check outside of the doctor's office.  We spoke at length about a whole food, plant-based diet.  Increase fiber intake.  He does not cook much and typically grabs something processed to take with him to work or to eat quickly after he has come home from work. Hyperlipidemia: Atorvastatin 80 mg daily.  LDL 122 in December 2022.  2023 cholesterol 186 LDL 116 HDL 43 triglycerides 136. Dietary changes may be difficult because of his job.  He loads railcars.  Hypertension: The current medical regimen is effective;  continue present plan and medications. Seasonal allergies: continue flonase.  Morbid obesity:   Current medicines are reviewed at length with the patient today.  The patient concerns regarding his medicines were addressed.  The following changes have been made:  No change  Labs/ tests ordered today include:   Orders Placed This Encounter  Procedures   MYOCARDIAL PERFUSION IMAGING    Recommend 150 minutes/week of aerobic exercise Low fat, low carb, high fiber diet recommended  Disposition:   FU in 1 year, or sooner if stress test is abnormal   Signed, Larae Grooms, MD  03/06/2022 [facility] [place], [address], [place]  [address] Phone: [phone]; Fax: [phone]

## 2022-03-09 NOTE — Addendum Note (Signed)
Addended by: Sharee Holster R on: 03/09/2022 11:53 AM   Modules accepted: Orders

## 2022-03-11 ENCOUNTER — Telehealth (HOSPITAL_COMMUNITY): Payer: Self-pay | Admitting: *Deleted

## 2022-03-11 NOTE — Telephone Encounter (Signed)
Per DPR left detailed instructions for MPI study scheduled on 03/13/22.

## 2022-03-13 ENCOUNTER — Other Ambulatory Visit: Payer: Self-pay | Admitting: *Deleted

## 2022-03-13 ENCOUNTER — Ambulatory Visit (HOSPITAL_COMMUNITY): Payer: Medicare Other | Attending: Cardiology

## 2022-03-13 DIAGNOSIS — R0609 Other forms of dyspnea: Secondary | ICD-10-CM | POA: Diagnosis not present

## 2022-03-13 DIAGNOSIS — I2584 Coronary atherosclerosis due to calcified coronary lesion: Secondary | ICD-10-CM | POA: Diagnosis not present

## 2022-03-13 DIAGNOSIS — I2510 Atherosclerotic heart disease of native coronary artery without angina pectoris: Secondary | ICD-10-CM | POA: Insufficient documentation

## 2022-03-13 LAB — MYOCARDIAL PERFUSION IMAGING
Angina Index: 0
Estimated workload: 8
Exercise duration (min): 6 min
Exercise duration (sec): 30 s
LV dias vol: 103 mL (ref 62–150)
LV sys vol: 33 mL
MPHR: 146 {beats}/min
Nuc Stress EF: 68 %
Peak HR: 126 {beats}/min
Percent HR: 86 %
Rest HR: 77 {beats}/min
Rest Nuclear Isotope Dose: 11 mCi
SDS: 2
SRS: 3
SSS: 5
Stress Nuclear Isotope Dose: 30.6 mCi
TID: 1.24

## 2022-03-13 MED ORDER — TECHNETIUM TC 99M TETROFOSMIN IV KIT
11.0000 | PACK | Freq: Once | INTRAVENOUS | Status: AC | PRN
  Administered 2022-03-13: 11 via INTRAVENOUS

## 2022-03-13 MED ORDER — NITROGLYCERIN 0.4 MG SL SUBL: 0.4000 mg | SUBLINGUAL_TABLET | SUBLINGUAL | 6 refills | Status: AC | PRN

## 2022-03-13 MED ORDER — TECHNETIUM TC 99M TETROFOSMIN IV KIT
30.6000 | PACK | Freq: Once | INTRAVENOUS | Status: AC | PRN
  Administered 2022-03-13: 30.6 via INTRAVENOUS

## 2022-03-13 MED ORDER — REGADENOSON 0.4 MG/5ML IV SOLN: 0.4000 mg | Freq: Once | INTRAVENOUS | Status: DC

## 2022-03-30 ENCOUNTER — Other Ambulatory Visit: Payer: Self-pay | Admitting: Internal Medicine

## 2022-04-29 DIAGNOSIS — C61 Malignant neoplasm of prostate: Secondary | ICD-10-CM | POA: Diagnosis not present

## 2022-05-06 DIAGNOSIS — R351 Nocturia: Secondary | ICD-10-CM | POA: Diagnosis not present

## 2022-05-06 DIAGNOSIS — C61 Malignant neoplasm of prostate: Secondary | ICD-10-CM | POA: Diagnosis not present

## 2022-06-30 ENCOUNTER — Ambulatory Visit: Payer: Medicare Other | Admitting: Internal Medicine

## 2022-07-21 ENCOUNTER — Encounter: Payer: Self-pay | Admitting: Internal Medicine

## 2022-07-21 ENCOUNTER — Ambulatory Visit (INDEPENDENT_AMBULATORY_CARE_PROVIDER_SITE_OTHER): Payer: Medicare Other | Admitting: Internal Medicine

## 2022-07-21 VITALS — BP 126/80 | HR 60 | Temp 98.7°F | Ht 70.0 in | Wt 231.0 lb

## 2022-07-21 DIAGNOSIS — I10 Essential (primary) hypertension: Secondary | ICD-10-CM

## 2022-07-21 DIAGNOSIS — E559 Vitamin D deficiency, unspecified: Secondary | ICD-10-CM | POA: Diagnosis not present

## 2022-07-21 DIAGNOSIS — E7800 Pure hypercholesterolemia, unspecified: Secondary | ICD-10-CM | POA: Diagnosis not present

## 2022-07-21 DIAGNOSIS — N320 Bladder-neck obstruction: Secondary | ICD-10-CM

## 2022-07-21 DIAGNOSIS — R739 Hyperglycemia, unspecified: Secondary | ICD-10-CM | POA: Diagnosis not present

## 2022-07-21 DIAGNOSIS — E538 Deficiency of other specified B group vitamins: Secondary | ICD-10-CM | POA: Diagnosis not present

## 2022-07-21 DIAGNOSIS — D485 Neoplasm of uncertain behavior of skin: Secondary | ICD-10-CM | POA: Insufficient documentation

## 2022-07-21 LAB — CBC WITH DIFFERENTIAL/PLATELET
Basophils Absolute: 0.1 10*3/uL (ref 0.0–0.1)
Basophils Relative: 1.2 % (ref 0.0–3.0)
Eosinophils Absolute: 0.1 10*3/uL (ref 0.0–0.7)
Eosinophils Relative: 1.4 % (ref 0.0–5.0)
HCT: 47 % (ref 39.0–52.0)
Hemoglobin: 15.5 g/dL (ref 13.0–17.0)
Lymphocytes Relative: 20 % (ref 12.0–46.0)
Lymphs Abs: 1.7 10*3/uL (ref 0.7–4.0)
MCHC: 33 g/dL (ref 30.0–36.0)
MCV: 98.9 fl (ref 78.0–100.0)
Monocytes Absolute: 0.7 10*3/uL (ref 0.1–1.0)
Monocytes Relative: 7.7 % (ref 3.0–12.0)
Neutro Abs: 5.9 10*3/uL (ref 1.4–7.7)
Neutrophils Relative %: 69.7 % (ref 43.0–77.0)
Platelets: 212 10*3/uL (ref 150.0–400.0)
RBC: 4.75 Mil/uL (ref 4.22–5.81)
RDW: 12.9 % (ref 11.5–15.5)
WBC: 8.5 10*3/uL (ref 4.0–10.5)

## 2022-07-21 LAB — LIPID PANEL
Cholesterol: 151 mg/dL (ref 0–200)
HDL: 40.2 mg/dL (ref >39.00)
LDL Cholesterol: 87 mg/dL (ref 0–99)
NonHDL: 110.73
Total CHOL/HDL Ratio: 4
Triglycerides: 120 mg/dL (ref 0.0–149.0)
VLDL: 24 mg/dL (ref 0.0–40.0)

## 2022-07-21 LAB — HEPATIC FUNCTION PANEL
ALT: 18 U/L (ref 0–53)
AST: 16 U/L (ref 0–37)
Albumin: 4.7 g/dL (ref 3.5–5.2)
Alkaline Phosphatase: 69 U/L (ref 39–117)
Bilirubin, Direct: 0.2 mg/dL (ref 0.0–0.3)
Total Bilirubin: 1 mg/dL (ref 0.2–1.2)
Total Protein: 7.4 g/dL (ref 6.0–8.3)

## 2022-07-21 LAB — BASIC METABOLIC PANEL
BUN: 15 mg/dL (ref 6–23)
CO2: 30 mEq/L (ref 19–32)
Calcium: 9.6 mg/dL (ref 8.4–10.5)
Chloride: 105 mEq/L (ref 96–112)
Creatinine, Ser: 1.25 mg/dL (ref 0.40–1.50)
GFR: 56.53 mL/min — ABNORMAL LOW (ref >60.00)
Glucose, Bld: 115 mg/dL — ABNORMAL HIGH (ref 70–99)
Potassium: 5 mEq/L (ref 3.5–5.1)
Sodium: 141 mEq/L (ref 135–145)

## 2022-07-21 LAB — HEMOGLOBIN A1C: Hgb A1c MFr Bld: 5.6 % (ref 4.6–6.5)

## 2022-07-21 LAB — TSH: TSH: 2.88 u[IU]/mL (ref 0.35–5.50)

## 2022-07-21 LAB — PSA: PSA: 0.21 ng/mL (ref 0.10–4.00)

## 2022-07-21 LAB — VITAMIN D 25 HYDROXY (VIT D DEFICIENCY, FRACTURES): VITD: 28.94 ng/mL — ABNORMAL LOW (ref 30.00–100.00)

## 2022-07-21 LAB — VITAMIN B12: Vitamin B-12: 622 pg/mL (ref 211–911)

## 2022-07-21 NOTE — Patient Instructions (Signed)

## 2022-07-21 NOTE — Progress Notes (Signed)
Patient ID: Benjamin Wilkerson, male   DOB: 26-Mar-1945, 77 y.o.   MRN: [id number]        Chief Complaint: follow up htn, HLD , low vit d and b12, hyperglycemia       HPI:  Benjamin Wilkerson is a 77 y.o. male here overall doing ok, Pt denies chest pain, increased sob or doe, wheezing, orthopnea, PND, increased LE swelling, palpitations, dizziness or syncope.   Pt denies polydipsia, polyuria, or new focal neuro s/s.    Pt denies fever, night sweats, loss of appetite, or other constitutional symptoms   Lost 15 lbs with better diet and activity Wt Readings from Last 3 Encounters:  07/21/22 231 lb (104.8 kg)  03/13/22 246 lb (111.6 kg)  03/06/22 246 lb (111.6 kg)   BP Readings from Last 3 Encounters:  07/21/22 126/80  03/06/22 138/72  12/29/21 138/76         Past Medical History:  Diagnosis Date   Hyperlipidemia    Hypertension    Prostate cancer [facility])    Past Surgical History:  Procedure Laterality Date   FRACTURE SURGERY     left wrist and arm    reports that he has never smoked. He has never used smokeless tobacco. He reports current alcohol use of about 7.0 - 14.0 standard drinks of alcohol per week. He reports that he does not use drugs. family history includes Alcohol abuse in his father; Diabetes in his mother; Heart disease in his father and mother. No Known Allergies Current Outpatient Medications on File Prior to Visit  Medication Sig Dispense Refill   amLODipine (NORVASC) 5 MG tablet TAKE 1 TABLET (5 MG TOTAL) BY MOUTH DAILY. 90 tablet 2   aspirin EC 81 MG tablet Take 81 mg by mouth daily.     atorvastatin (LIPITOR) 80 MG tablet Take 1 tablet (80 mg total) by mouth daily. 90 tablet 3   cholecalciferol (VITAMIN D3) 25 MCG (1000 UNIT) tablet Take 1,000 Units by mouth daily.     fluticasone (FLONASE) 50 MCG/ACT nasal spray Place 2 sprays into both nostrils daily. 16 g 6   metoprolol succinate (TOPROL-XL) 50 MG 24 hr tablet TAKE 1 TABLET DAILY WITH OR IMMEDIATELY FOLLOWING A MEAL AS  DIRECTED 90 tablet 3   nitroGLYCERIN (NITROSTAT) 0.4 MG SL tablet Place 1 tablet (0.4 mg total) under the tongue every 5 (five) minutes as needed for chest pain. 25 tablet 6   Current Facility-Administered Medications on File Prior to Visit  Medication Dose Route Frequency Provider Last Rate Last Admin   regadenoson (LEXISCAN) injection SOLN 0.4 mg  0.4 mg Intravenous Once Corky Crafts, MD            ROS:  All others reviewed and negative.  Objective        PE:  BP 126/80 (BP Location: Right Arm, Patient Position: Sitting, Cuff Size: Normal)   Pulse 60   Temp 98.7 F (37.1 C) (Oral)   Ht 5\' 10"  (1.778 m)   Wt 231 lb (104.8 kg)   SpO2 97%   BMI 33.15 kg/m                 Constitutional: Pt appears in NAD               HENT: Head: NCAT.                Right Ear: External ear normal.  Left Ear: External ear normal.                Eyes: . Pupils are equal, round, and reactive to light. Conjunctivae and EOM are normal               Nose: without d/c or deformity               Neck: Neck supple. Gross normal ROM               Cardiovascular: Normal rate and regular rhythm.                 Pulmonary/Chest: Effort normal and breath sounds without rales or wheezing.                Abd:  Soft, NT, ND, + BS, no organomegaly               Neurological: Pt is alert. At baseline orientation, motor grossly intact               Skin: Skin is warm. No rashes, no other new lesions, LE edema - none               Psychiatric: Pt behavior is normal without agitation   Micro: none  Cardiac tracings I have personally interpreted today:  none  Pertinent Radiological findings (summarize): none   Lab Results  Component Value Date   WBC 8.5 07/21/2022   HGB 15.5 07/21/2022   HCT 47.0 07/21/2022   PLT 212.0 07/21/2022   GLUCOSE 115 (H) 07/21/2022   CHOL 151 07/21/2022   TRIG 120.0 07/21/2022   HDL 40.20 07/21/2022   LDLDIRECT 187.8 02/04/2009   LDLCALC 87 07/21/2022    ALT 18 07/21/2022   AST 16 07/21/2022   NA 141 07/21/2022   K 5.0 07/21/2022   CL 105 07/21/2022   CREATININE 1.25 07/21/2022   BUN 15 07/21/2022   CO2 30 07/21/2022   TSH 2.88 07/21/2022   PSA 0.21 07/21/2022   HGBA1C 5.6 07/21/2022   Assessment/Plan:  Benjamin Wilkerson is a 77 y.o. White or Caucasian [1] male with  has a past medical history of Hyperlipidemia, Hypertension, and Prostate cancer ([facility]).  HLD (hyperlipidemia) Lab Results  Component Value Date   LDLCALC 87 07/21/2022   Stable, pt to continue current statin liptior 80 mg qd   Essential hypertension BP Readings from Last 3 Encounters:  07/21/22 126/80  03/06/22 138/72  12/29/21 138/76   Stable, pt to continue medical treatment norvasc 5 every day, toprol xl 50 every day,    Vitamin D deficiency Last vitamin D Lab Results  Component Value Date   VD25OH 28.94 (L) 07/21/2022   Low, to start oral replacement   B12 deficiency Lab Results  Component Value Date   VITAMINB12 622 07/21/2022   Stable, cont oral replacement - b12 1000 mcg qd   Hyperglycemia Lab Results  Component Value Date   HGBA1C 5.6 07/21/2022   Stable, pt to continue current medical treatment   diet, wt control  Followup: Return in about 6 months (around 01/20/2023).  Benjamin Barre, MD 07/24/2022 8:50 PM [facility]

## 2022-07-22 LAB — URINALYSIS, ROUTINE W REFLEX MICROSCOPIC
Hgb urine dipstick: NEGATIVE
Ketones, ur: NEGATIVE
Leukocytes,Ua: NEGATIVE
Nitrite: NEGATIVE
RBC / HPF: NONE SEEN (ref 0–?)
Specific Gravity, Urine: >=1.03 — AB (ref 1.000–1.030)
Total Protein, Urine: NEGATIVE
Urine Glucose: NEGATIVE
Urobilinogen, UA: 0.2 (ref 0.0–1.0)
WBC, UA: NONE SEEN (ref 0–?)
pH: 5.5 (ref 5.0–8.0)

## 2022-07-22 NOTE — Progress Notes (Signed)
The test results show that your current treatment is OK, as the tests are stable.  Please continue the same plan.  There is no other need for change of treatment or further evaluation based on these results, at this time.  thanks 

## 2022-07-24 ENCOUNTER — Encounter: Payer: Self-pay | Admitting: Internal Medicine

## 2022-07-24 DIAGNOSIS — R739 Hyperglycemia, unspecified: Secondary | ICD-10-CM | POA: Insufficient documentation

## 2022-07-24 NOTE — Assessment & Plan Note (Signed)
Lab Results  Component Value Date   HGBA1C 5.6 07/21/2022   Stable, pt to continue current medical treatment   diet, wt control

## 2022-07-24 NOTE — Assessment & Plan Note (Signed)
BP Readings from Last 3 Encounters:  07/21/22 126/80  03/06/22 138/72  12/29/21 138/76   Stable, pt to continue medical treatment norvasc 5 every day, toprol xl 50 every day,

## 2022-07-24 NOTE — Assessment & Plan Note (Signed)
Lab Results  Component Value Date   LDLCALC 87 07/21/2022   Stable, pt to continue current statin liptior 80 mg qd

## 2022-07-24 NOTE — Assessment & Plan Note (Signed)
Last vitamin D Lab Results  Component Value Date   VD25OH 28.94 (L) 07/21/2022   Low, to start oral replacement

## 2022-07-24 NOTE — Assessment & Plan Note (Signed)
Lab Results  Component Value Date   VITAMINB12 622 07/21/2022   Stable, cont oral replacement - b12 1000 mcg qd

## 2022-10-02 ENCOUNTER — Ambulatory Visit: Payer: Medicare Other | Admitting: Interventional Cardiology

## 2022-10-30 DIAGNOSIS — C61 Malignant neoplasm of prostate: Secondary | ICD-10-CM | POA: Diagnosis not present

## 2022-11-06 DIAGNOSIS — C61 Malignant neoplasm of prostate: Secondary | ICD-10-CM | POA: Diagnosis not present

## 2022-11-06 DIAGNOSIS — R351 Nocturia: Secondary | ICD-10-CM | POA: Diagnosis not present

## 2022-11-06 DIAGNOSIS — N401 Enlarged prostate with lower urinary tract symptoms: Secondary | ICD-10-CM | POA: Diagnosis not present

## 2022-11-16 ENCOUNTER — Ambulatory Visit: Payer: Medicare Other | Attending: Interventional Cardiology | Admitting: Physician Assistant

## 2022-11-16 NOTE — Progress Notes (Deleted)
Cardiology Office Note:  .   Date:  11/16/2022  ID:  Benjamin Wilkerson, Benjamin Wilkerson 1945/10/07, MRN [id number] PCP: Corwin Levins, MD  [facility] Providers Cardiologist:  Lance Muss, MD {  History of Present Illness: .   Benjamin Wilkerson is a 77 y.o. male with a past medical history of elevated coronary calcium score, hypertension, and hyperlipidemia here for follow-up appointment.  Coronary CT scan calcium score was 884 which was 77 percentile for age, sex, and race matched controls.  Hospitalization 11/23 at [facility] regional with confusion and transient global amnesia.  No further episodes.  Reported some DOE.  Family history includes younger brother having a stent placed in his heart.  Last seen 03/06/2022.  Today, he***  ROS: ***  Studies Reviewed: .        *** Risk Assessment/Calculations:   {Does this patient have ATRIAL FIBRILLATION?[phone]} No BP recorded.  {Refresh Note OR Click here to enter BP  :1}***       Physical Exam:   VS:  There were no vitals taken for this visit.   Wt Readings from Last 3 Encounters:  07/21/22 231 lb (104.8 kg)  03/13/22 246 lb (111.6 kg)  03/06/22 246 lb (111.6 kg)    GEN: Well nourished, well developed in no acute distress NECK: No JVD; No carotid bruits CARDIAC: ***RRR, no murmurs, rubs, gallops RESPIRATORY:  Clear to auscultation without rales, wheezing or rhonchi  ABDOMEN: Soft, non-tender, non-distended EXTREMITIES:  No edema; No deformity   ASSESSMENT AND PLAN: .   1.  Elevated coronary calcium score -He had a stress test back in February 2024 which was suspicious for prior inferior MI perhaps the RCA CTO or circumflex CTO.  At that time he did not have any chest pain or shortness of breath and cath was discussed but not pursued due to lack of symptoms.  He was provided sublingual nitro and asked to call if symptoms developed.  2.  Hyperlipidemia 3.  Hypertension    {Are you ordering a CV Procedure (e.g. stress  test, cath, DCCV, TEE, etc)?   Press F2        :[id number]}  Dispo: ***  Signed, Sharlene Dory, PA-C

## 2022-11-17 ENCOUNTER — Encounter: Payer: Self-pay | Admitting: Physician Assistant

## 2022-12-09 ENCOUNTER — Other Ambulatory Visit: Payer: Self-pay

## 2022-12-09 ENCOUNTER — Other Ambulatory Visit: Payer: Self-pay | Admitting: Internal Medicine

## 2022-12-16 ENCOUNTER — Ambulatory Visit: Payer: Medicare Other

## 2022-12-16 VITALS — Ht 70.0 in | Wt 230.0 lb

## 2022-12-16 DIAGNOSIS — Z0000 Encounter for general adult medical examination without abnormal findings: Secondary | ICD-10-CM

## 2022-12-16 NOTE — Progress Notes (Signed)
Subjective:   Benjamin Wilkerson is a 77 y.o. male who presents for Medicare Annual/Subsequent preventive examination.  Visit Complete: Virtual I connected with  Pablo Lawrence on 12/16/22 by a audio enabled telemedicine application and verified that I am speaking with the correct person using two identifiers.  Patient Location: Home  Provider Location: Office/Clinic  I discussed the limitations of evaluation and management by telemedicine. The patient expressed understanding and agreed to proceed.  Vital Signs: Because this visit was a virtual/telehealth visit, some criteria may be missing or patient reported. Any vitals not documented were not able to be obtained and vitals that have been documented are patient reported.  Cardiac Risk Factors include: advanced age (>1men, >71 women);dyslipidemia     Objective:    Today's Vitals   12/16/22 0853  Weight: 230 lb (104.3 kg)  Height: 5\' 10"  (1.778 m)  PainSc: 0-No pain   Body mass index is 33 kg/m.     12/16/2022    8:55 AM 12/09/2021    3:49 PM 07/11/2019    9:30 AM 02/14/2019    2:47 PM  Advanced Directives  Does Patient Have a Medical Advance Directive? No No No No  Would patient like information on creating a medical advance directive? No - Patient declined No - Patient declined  Yes (MAU/Ambulatory/Procedural Areas - Information given)    Current Medications (verified) Outpatient Encounter Medications as of 12/16/2022  Medication Sig   amLODipine (NORVASC) 5 MG tablet TAKE 1 TABLET (5 MG TOTAL) BY MOUTH DAILY.   aspirin EC 81 MG tablet Take 81 mg by mouth daily.   atorvastatin (LIPITOR) 80 MG tablet TAKE 1 TABLET BY MOUTH EVERY DAY   cholecalciferol (VITAMIN D3) 25 MCG (1000 UNIT) tablet Take 1,000 Units by mouth daily.   fluticasone (FLONASE) 50 MCG/ACT nasal spray Place 2 sprays into both nostrils daily.   metoprolol succinate (TOPROL-XL) 50 MG 24 hr tablet TAKE 1 TABLET DAILY WITH OR IMMEDIATELY FOLLOWING A MEAL AS  DIRECTED   nitroGLYCERIN (NITROSTAT) 0.4 MG SL tablet Place 1 tablet (0.4 mg total) under the tongue every 5 (five) minutes as needed for chest pain.   Facility-Administered Encounter Medications as of 12/16/2022  Medication   regadenoson (LEXISCAN) injection SOLN 0.4 mg    Allergies (verified) Patient has no known allergies.   History: Past Medical History:  Diagnosis Date   Hyperlipidemia    Hypertension    Prostate cancer (HCC)    Past Surgical History:  Procedure Laterality Date   FRACTURE SURGERY     left wrist and arm   Family History  Problem Relation Age of Onset   Heart disease Mother        CAD/MI   Diabetes Mother    Heart disease Father        CAD/MI   Alcohol abuse Father    Cancer Neg Hx    COPD Neg Hx    Breast cancer Neg Hx    Colon cancer Neg Hx    Pancreatic cancer Neg Hx    Prostate cancer Neg Hx    Social History   Socioeconomic History   Marital status: Divorced    Spouse name: Not on file   Number of children: 2   Years of education: 16   Highest education level: Not on file  Occupational History   Occupation: Contractor    Comment: retired  Tobacco Use   Smoking status: Never   Smokeless tobacco: Never  Advertising account planner  Vaping status: Never Used  Substance and Sexual Activity   Alcohol use: Yes    Alcohol/week: 7.0 - 14.0 standard drinks of alcohol    Types: 7 - 14 Standard drinks or equivalent per week   Drug use: No   Sexual activity: Yes    Partners: Female    Birth control/protection: None  Other Topics Concern   Not on file  Social History Narrative   HSG, COLLEGE. MARRIED 3 YEARS, DIVORCED,  MARRIED 58 YEARS - DIVORCED. LONG TERM MONOGAMOUS RELATIONSHIP. 2 DAUGHTERS  1970 ; 1979.  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    Social Determinants of Health   Financial Resource Strain: Low Risk  (12/16/2022)   Overall Financial Resource Strain (CARDIA)    Difficulty  of Paying Living Expenses: Not hard at all  Food Insecurity: No Food Insecurity (12/16/2022)   Hunger Vital Sign    Worried About Running Out of Food in the Last Year: Never true    Ran Out of Food in the Last Year: Never true  Transportation Needs: No Transportation Needs (12/16/2022)   PRAPARE - Administrator, Civil Service (Medical): No    Lack of Transportation (Non-Medical): No  Physical Activity: Sufficiently Active (12/16/2022)   Exercise Vital Sign    Days of Exercise per Week: 5 days    Minutes of Exercise per Session: 30 min  Stress: No Stress Concern Present (12/16/2022)   [employer] of Occupational Health - Occupational Stress Questionnaire    Feeling of Stress : Not at all  Social Connections: Moderately Integrated (12/16/2022)   Social Connection and Isolation Panel [NHANES]    Frequency of Communication with Friends and Family: More than three times a week    Frequency of Social Gatherings with Friends and Family: More than three times a week    Attends Religious Services: More than 4 times per year    Active Member of [employer] or Organizations: Yes    Attends Engineer, structural: More than 4 times per year    Marital Status: Divorced    Tobacco Counseling Counseling given: Not Answered   Clinical Intake:  Pre-visit preparation completed: Yes  Pain : No/denies pain Pain Score: 0-No pain     BMI - recorded: 33 Nutritional Status: BMI > 30  Obese Nutritional Risks: None Diabetes: No  How often do you need to have someone help you when you read instructions, pamphlets, or other written materials from your doctor or pharmacy?: 1 - Never What is the last grade level you completed in school?: 10TH GRADE  Interpreter Needed?: No  Information entered by :: Demika Langenderfer N. Salsabeel Gorelick, LPN.   Activities of Daily Living    12/16/2022    8:59 AM  In your present state of health, do you have any difficulty performing the following  activities:  Hearing? 1  Vision? 0  Difficulty concentrating or making decisions? 0  Walking or climbing stairs? 0  Dressing or bathing? 0  Doing errands, shopping? 0  Preparing Food and eating ? N  Using the Toilet? N  In the past six months, have you accidently leaked urine? N  Do you have problems with loss of bowel control? N  Managing your Medications? N  Managing your Finances? N  Housekeeping or managing your Housekeeping? N    Patient Care Team: Corwin Levins, MD as PCP - General (Internal Medicine) Corky Crafts, MD as PCP - Cardiology (Cardiology) Billie Ruddy, OD as Referring  Physician (Optometry)  Indicate any recent Medical Services you may have received from other than [facility] providers in the past year (date may be approximate).     Assessment:   This is a routine wellness examination for [place].  Hearing/Vision screen Hearing Screening - Comments:: Patient has hearing difficulty; No hearing aids. Vision Screening - Comments:: Patient does wear corrective lenses/contacts/readers.  Annual eye exam done by: Billie Ruddy, MD. (recently retired)    Goals Addressed             This Visit's Progress    To lose weight and continue to eat healthy.        Depression Screen    12/16/2022    8:57 AM 07/21/2022    3:02 PM 12/09/2021    3:51 PM 09/24/2021    8:50 AM 01/15/2021    4:08 PM 07/15/2020    3:42 PM 07/15/2020    3:25 PM  PHQ 2/9 Scores  PHQ - 2 Score 0 0 0 0 0 0 0  PHQ- 9 Score 0   0       Fall Risk    12/16/2022    8:56 AM 07/21/2022    3:02 PM 12/09/2021    3:50 PM 09/24/2021    8:49 AM 01/15/2021    4:08 PM  Fall Risk   Falls in the past year? 0 0 0 0 0  Number falls in past yr: 0 0 0 0 0  Injury with Fall? 0 0 0 0 0  Risk for fall due to : No Fall Risks No Fall Risks No Fall Risks No Fall Risks   Follow up Falls prevention discussed Falls evaluation completed Falls prevention discussed Falls evaluation completed     MEDICARE  RISK AT HOME: Medicare Risk at Home Any stairs in or around the home?: No If so, are there any without handrails?: No Home free of loose throw rugs in walkways, pet beds, electrical cords, etc?: Yes Adequate lighting in your home to reduce risk of falls?: Yes Life alert?: No Use of a cane, walker or w/c?: No Grab bars in the bathroom?: Yes (in the shower) Shower chair or bench in shower?: No Elevated toilet seat or a handicapped toilet?: No  TIMED UP AND GO:  Was the test performed?  No    Cognitive Function:    12/16/2022    9:01 AM  MMSE - Mini Mental State Exam  Not completed: Unable to complete        12/16/2022    8:57 AM 12/09/2021    3:58 PM  6CIT Screen  What Year? 0 points 0 points  What month? 0 points 0 points  What time? 0 points 0 points  Count back from 20 0 points 0 points  Months in reverse 0 points 0 points  Repeat phrase 0 points 0 points  Total Score 0 points 0 points    Immunizations Immunization History  Administered Date(s) Administered   Influenza,inj,Quad PF,6+ Mos 11/10/2013   PFIZER(Purple Top)SARS-COV-2 Vaccination 04/09/2019, 05/09/2019, 11/20/2019, 06/24/2020    TDAP status: Due, Education has been provided regarding the importance of this vaccine. Advised may receive this vaccine at local pharmacy or Health Dept. Aware to provide a copy of the vaccination record if obtained from local pharmacy or Health Dept. Verbalized acceptance and understanding.  Flu Vaccine status: Declined, Education has been provided regarding the importance of this vaccine but patient still declined. Advised may receive this vaccine at local pharmacy or Health Dept. Aware  to provide a copy of the vaccination record if obtained from local pharmacy or Health Dept. Verbalized acceptance and understanding.  Pneumococcal vaccine status: Declined,  Education has been provided regarding the importance of this vaccine but patient still declined. Advised may receive this  vaccine at local pharmacy or Health Dept. Aware to provide a copy of the vaccination record if obtained from local pharmacy or Health Dept. Verbalized acceptance and understanding.   Covid-19 vaccine status: Completed vaccines  Qualifies for Shingles Vaccine? Yes   Zostavax completed No   Shingrix Completed?: No.    Education has been provided regarding the importance of this vaccine. Patient has been advised to call insurance company to determine out of pocket expense if they have not yet received this vaccine. Advised may also receive vaccine at local pharmacy or Health Dept. Verbalized acceptance and understanding.  Screening Tests Health Maintenance  Topic Date Due   DTaP/Tdap/Td (1 - Tdap) Never done   Zoster Vaccines- Shingrix (1 of 2) Never done   Colonoscopy  Never done   Pneumonia Vaccine 52+ Years old (1 of 1 - PCV) Never done   INFLUENZA VACCINE  09/03/2022   Medicare Annual Wellness (AWV)  12/16/2023   Hepatitis C Screening  Completed   HPV VACCINES  Aged Out   COVID-19 Vaccine  Discontinued    Health Maintenance  Health Maintenance Due  Topic Date Due   DTaP/Tdap/Td (1 - Tdap) Never done   Zoster Vaccines- Shingrix (1 of 2) Never done   Colonoscopy  Never done   Pneumonia Vaccine 72+ Years old (1 of 1 - PCV) Never done   INFLUENZA VACCINE  09/03/2022    Colorectal Cancer Screening: Never done.  Lung Cancer Screening: (Low Dose CT Chest recommended if Age 62-80 years, 20 pack-year currently smoking OR have quit w/in 15years.) does not qualify.   Lung Cancer Screening Referral: no  Additional Screening:  Hepatitis C Screening: does qualify; Completed 06/16/2017  Vision Screening: Recommended annual ophthalmology exams for early detection of glaucoma and other disorders of the eye. Is the patient up to date with their annual eye exam?  Yes  Who is the provider or what is the name of the office in which the patient attends annual eye exams? [facility]. If  pt is not established with a provider, would they like to be referred to a provider to establish care? No .   Dental Screening: Recommended annual dental exams for proper oral hygiene  Diabetic Foot Exam: N/A  Community Resource Referral / Chronic Care Management: CRR required this visit?  No   CCM required this visit?  No     Plan:     I have personally reviewed and noted the following in the patient's chart:   Medical and social history Use of alcohol, tobacco or illicit drugs  Current medications and supplements including opioid prescriptions. Patient is not currently taking opioid prescriptions. Functional ability and status Nutritional status Physical activity Advanced directives List of other physicians Hospitalizations, surgeries, and ER visits in previous 12 months Vitals Screenings to include cognitive, depression, and falls Referrals and appointments  In addition, I have reviewed and discussed with patient certain preventive protocols, quality metrics, and best practice recommendations. A written personalized care plan for preventive services as well as general preventive health recommendations were provided to patient.     Mickeal Needy, LPN   44/02/270   After Visit Summary: (Mail) Due to this being a telephonic visit, the after visit summary  with patients personalized plan was offered to patient via mail   Nurse Notes: none

## 2022-12-16 NOTE — Patient Instructions (Signed)
Mr. Benjamin Wilkerson , Thank you for taking time to come for your Medicare Wellness Visit. I appreciate your ongoing commitment to your health goals. Please review the following plan we discussed and let me know if I can assist you in the future.   Referrals/Orders/Follow-Ups/Clinician Recommendations: No  This is a list of the screening recommended for you and due dates:  Health Maintenance  Topic Date Due   DTaP/Tdap/Td vaccine (1 - Tdap) Never done   Zoster (Shingles) Vaccine (1 of 2) Never done   Colon Cancer Screening  Never done   Pneumonia Vaccine (1 of 1 - PCV) Never done   Flu Shot  09/03/2022   Medicare Annual Wellness Visit  12/16/2023   Hepatitis C Screening  Completed   HPV Vaccine  Aged Out   COVID-19 Vaccine  Discontinued    Advanced directives: (Declined) Advance directive discussed with you today. Even though you declined this today, please call our office should you change your mind, and we can give you the proper paperwork for you to fill out.  Next Medicare Annual Wellness Visit scheduled for next year: Yes  *Preventive Care attachment

## 2023-01-20 ENCOUNTER — Ambulatory Visit (INDEPENDENT_AMBULATORY_CARE_PROVIDER_SITE_OTHER): Payer: Medicare Other | Admitting: Internal Medicine

## 2023-01-20 ENCOUNTER — Encounter: Payer: Self-pay | Admitting: Internal Medicine

## 2023-01-20 VITALS — BP 120/70 | HR 55 | Temp 99.0°F | Ht 70.0 in | Wt 244.0 lb

## 2023-01-20 DIAGNOSIS — I10 Essential (primary) hypertension: Secondary | ICD-10-CM | POA: Diagnosis not present

## 2023-01-20 DIAGNOSIS — E559 Vitamin D deficiency, unspecified: Secondary | ICD-10-CM | POA: Diagnosis not present

## 2023-01-20 DIAGNOSIS — E538 Deficiency of other specified B group vitamins: Secondary | ICD-10-CM | POA: Diagnosis not present

## 2023-01-20 DIAGNOSIS — R739 Hyperglycemia, unspecified: Secondary | ICD-10-CM | POA: Diagnosis not present

## 2023-01-20 DIAGNOSIS — E7800 Pure hypercholesterolemia, unspecified: Secondary | ICD-10-CM | POA: Diagnosis not present

## 2023-01-20 MED ORDER — AMLODIPINE BESYLATE 5 MG PO TABS: 5.0000 mg | ORAL_TABLET | Freq: Every day | ORAL | 3 refills | Status: DC

## 2023-01-20 MED ORDER — METOPROLOL SUCCINATE ER 50 MG PO TB24
ORAL_TABLET | ORAL | 3 refills | Status: DC
Start: 2023-01-20 — End: 2023-10-15

## 2023-01-20 NOTE — Patient Instructions (Signed)
Please continue all other medications as before, and refills have been done if requested.  Please have the pharmacy call with any other refills you may need.  Please continue your efforts at being more active, low cholesterol diet, and weight control.  Please keep your appointments with your specialists as you may have planned  Please make an Appointment to return in 6 months, or sooner if needed 

## 2023-01-20 NOTE — Progress Notes (Signed)
Patient ID: Benjamin Wilkerson, male   DOB: 11-Dec-1945, 77 y.o.   MRN: [id number]        Chief Complaint: follow up HTN, HLD and hyperglycemia , low vit d and b12       HPI:  Benjamin Wilkerson is a 77 y.o. male here overall doing ok,  Pt denies chest pain, increased sob or doe, wheezing, orthopnea, PND, increased LE swelling, palpitations, dizziness or syncope.   Pt denies polydipsia, polyuria, or new focal neuro s/s.    Pt denies fever, wt loss, night sweats, loss of appetite, or other constitutional symptoms         Wt Readings from Last 3 Encounters:  01/20/23 244 lb (110.7 kg)  12/16/22 230 lb (104.3 kg)  07/21/22 231 lb (104.8 kg)   BP Readings from Last 3 Encounters:  01/20/23 120/70  07/21/22 126/80  03/06/22 138/72         Past Medical History:  Diagnosis Date   Hyperlipidemia    Hypertension    Prostate cancer [facility])    Past Surgical History:  Procedure Laterality Date   FRACTURE SURGERY     left wrist and arm    reports that he has never smoked. He has never used smokeless tobacco. He reports current alcohol use of about 7.0 - 14.0 standard drinks of alcohol per week. He reports that he does not use drugs. family history includes Alcohol abuse in his father; Diabetes in his mother; Heart disease in his father and mother. No Known Allergies Current Outpatient Medications on File Prior to Visit  Medication Sig Dispense Refill   aspirin EC 81 MG tablet Take 81 mg by mouth daily.     atorvastatin (LIPITOR) 80 MG tablet TAKE 1 TABLET BY MOUTH EVERY DAY 90 tablet 3   cholecalciferol (VITAMIN D3) 25 MCG (1000 UNIT) tablet Take 1,000 Units by mouth daily.     fluticasone (FLONASE) 50 MCG/ACT nasal spray Place 2 sprays into both nostrils daily. 16 g 6   nitroGLYCERIN (NITROSTAT) 0.4 MG SL tablet Place 1 tablet (0.4 mg total) under the tongue every 5 (five) minutes as needed for chest pain. 25 tablet 6   No current facility-administered medications on file prior to visit.         ROS:  All others reviewed and negative.  Objective        PE:  BP 120/70 (BP Location: Right Arm, Patient Position: Sitting, Cuff Size: Normal)   Pulse (!) 55   Temp 99 F (37.2 C) (Oral)   Ht 5\' 10"  (1.778 m)   Wt 244 lb (110.7 kg)   SpO2 98%   BMI 35.01 kg/m                 Constitutional: Pt appears in NAD               HENT: Head: NCAT.                Right Ear: External ear normal.                 Left Ear: External ear normal.                Eyes: . Pupils are equal, round, and reactive to light. Conjunctivae and EOM are normal               Nose: without d/c or deformity  Neck: Neck supple. Gross normal ROM               Cardiovascular: Normal rate and regular rhythm.                 Pulmonary/Chest: Effort normal and breath sounds without rales or wheezing.                Abd:  Soft, NT, ND, + BS, no organomegaly               Neurological: Pt is alert. At baseline orientation, motor grossly intact               Skin: Skin is warm. No rashes, no other new lesions, LE edema - none               Psychiatric: Pt behavior is normal without agitation   Micro: none  Cardiac tracings I have personally interpreted today:  none  Pertinent Radiological findings (summarize): none   Lab Results  Component Value Date   WBC 8.5 07/21/2022   HGB 15.5 07/21/2022   HCT 47.0 07/21/2022   PLT 212.0 07/21/2022   GLUCOSE 115 (H) 07/21/2022   CHOL 151 07/21/2022   TRIG 120.0 07/21/2022   HDL 40.20 07/21/2022   LDLDIRECT 187.8 02/04/2009   LDLCALC 87 07/21/2022   ALT 18 07/21/2022   AST 16 07/21/2022   NA 141 07/21/2022   K 5.0 07/21/2022   CL 105 07/21/2022   CREATININE 1.25 07/21/2022   BUN 15 07/21/2022   CO2 30 07/21/2022   TSH 2.88 07/21/2022   PSA 0.21 07/21/2022   HGBA1C 5.6 07/21/2022   Assessment/Plan:  Benjamin Wilkerson is a 77 y.o. White or Caucasian [1] male with  has a past medical history of Hyperlipidemia, Hypertension, and Prostate cancer  (HCC).  HLD (hyperlipidemia) Lab Results  Component Value Date   LDLCALC 87 07/21/2022   uncontrolled, pt to continue current statin lipitor 80 mg every day and lower chol diet , declines further change at this time   Essential hypertension BP Readings from Last 3 Encounters:  01/20/23 120/70  07/21/22 126/80  03/06/22 138/72   Stable, pt to continue medical treatment norvasc 5 every day, toprol xl 50 qd   Vitamin D deficiency Last vitamin D Lab Results  Component Value Date   VD25OH 28.94 (L) 07/21/2022   Low, to start oral replacement   B12 deficiency Lab Results  Component Value Date   VITAMINB12 622 07/21/2022   Stable, cont oral replacement - b12 1000 mcg qd   Hyperglycemia Lab Results  Component Value Date   HGBA1C 5.6 07/21/2022   Stable, pt to continue current medical treatment  - diet, wt control  Followup: Return in about 6 months (around 07/21/2023).  Oliver Barre, MD 01/23/2023 1:59 PM [facility]

## 2023-01-23 ENCOUNTER — Encounter: Payer: Self-pay | Admitting: Internal Medicine

## 2023-01-23 DIAGNOSIS — R931 Abnormal findings on diagnostic imaging of heart and coronary circulation: Secondary | ICD-10-CM | POA: Insufficient documentation

## 2023-01-23 DIAGNOSIS — I700 Atherosclerosis of aorta: Secondary | ICD-10-CM | POA: Insufficient documentation

## 2023-01-23 NOTE — Assessment & Plan Note (Signed)
BP Readings from Last 3 Encounters:  01/20/23 120/70  07/21/22 126/80  03/06/22 138/72   Stable, pt to continue medical treatment norvasc 5 every day, toprol xl 50 qd

## 2023-01-23 NOTE — Assessment & Plan Note (Addendum)
Lab Results  Component Value Date   LDLCALC 87 07/21/2022   uncontrolled, pt to continue current statin lipitor 80 mg every day and lower chol diet , declines further change at this time

## 2023-01-23 NOTE — Assessment & Plan Note (Signed)
Lab Results  Component Value Date   VITAMINB12 622 07/21/2022   Stable, cont oral replacement - b12 1000 mcg qd

## 2023-01-23 NOTE — Assessment & Plan Note (Signed)
Last vitamin D Lab Results  Component Value Date   VD25OH 28.94 (L) 07/21/2022   Low, to start oral replacement

## 2023-01-23 NOTE — Assessment & Plan Note (Signed)
Lab Results  Component Value Date   HGBA1C 5.6 07/21/2022   Stable, pt to continue current medical treatment   diet, wt control

## 2023-06-14 DIAGNOSIS — C61 Malignant neoplasm of prostate: Secondary | ICD-10-CM | POA: Diagnosis not present

## 2023-06-18 DIAGNOSIS — N401 Enlarged prostate with lower urinary tract symptoms: Secondary | ICD-10-CM | POA: Diagnosis not present

## 2023-06-18 DIAGNOSIS — C61 Malignant neoplasm of prostate: Secondary | ICD-10-CM | POA: Diagnosis not present

## 2023-06-18 DIAGNOSIS — R310 Gross hematuria: Secondary | ICD-10-CM | POA: Diagnosis not present

## 2023-06-18 DIAGNOSIS — R351 Nocturia: Secondary | ICD-10-CM | POA: Diagnosis not present

## 2023-07-05 DIAGNOSIS — R310 Gross hematuria: Secondary | ICD-10-CM | POA: Diagnosis not present

## 2023-07-12 DIAGNOSIS — N3289 Other specified disorders of bladder: Secondary | ICD-10-CM | POA: Diagnosis not present

## 2023-07-12 DIAGNOSIS — C61 Malignant neoplasm of prostate: Secondary | ICD-10-CM | POA: Diagnosis not present

## 2023-07-12 DIAGNOSIS — K429 Umbilical hernia without obstruction or gangrene: Secondary | ICD-10-CM | POA: Diagnosis not present

## 2023-07-12 DIAGNOSIS — N401 Enlarged prostate with lower urinary tract symptoms: Secondary | ICD-10-CM | POA: Diagnosis not present

## 2023-07-12 DIAGNOSIS — R310 Gross hematuria: Secondary | ICD-10-CM | POA: Diagnosis not present

## 2023-07-23 ENCOUNTER — Encounter: Payer: Self-pay | Admitting: Internal Medicine

## 2023-07-23 ENCOUNTER — Ambulatory Visit: Payer: Self-pay | Admitting: Internal Medicine

## 2023-07-23 ENCOUNTER — Ambulatory Visit: Payer: Medicare Other | Admitting: Internal Medicine

## 2023-07-23 VITALS — BP 124/70 | HR 56 | Temp 98.6°F | Ht 70.0 in | Wt 252.0 lb

## 2023-07-23 DIAGNOSIS — E538 Deficiency of other specified B group vitamins: Secondary | ICD-10-CM | POA: Diagnosis not present

## 2023-07-23 DIAGNOSIS — E7800 Pure hypercholesterolemia, unspecified: Secondary | ICD-10-CM

## 2023-07-23 DIAGNOSIS — I10 Essential (primary) hypertension: Secondary | ICD-10-CM

## 2023-07-23 DIAGNOSIS — R739 Hyperglycemia, unspecified: Secondary | ICD-10-CM | POA: Diagnosis not present

## 2023-07-23 DIAGNOSIS — E559 Vitamin D deficiency, unspecified: Secondary | ICD-10-CM

## 2023-07-23 LAB — CBC WITH DIFFERENTIAL/PLATELET
Basophils Absolute: 0.1 10*3/uL (ref 0.0–0.1)
Basophils Relative: 1 % (ref 0.0–3.0)
Eosinophils Absolute: 0.2 10*3/uL (ref 0.0–0.7)
Eosinophils Relative: 3.1 % (ref 0.0–5.0)
HCT: 43.9 % (ref 39.0–52.0)
Hemoglobin: 15 g/dL (ref 13.0–17.0)
Lymphocytes Relative: 17.9 % (ref 12.0–46.0)
Lymphs Abs: 1.4 10*3/uL (ref 0.7–4.0)
MCHC: 34.2 g/dL (ref 30.0–36.0)
MCV: 96.9 fl (ref 78.0–100.0)
Monocytes Absolute: 0.6 10*3/uL (ref 0.1–1.0)
Monocytes Relative: 7.8 % (ref 3.0–12.0)
Neutro Abs: 5.4 10*3/uL (ref 1.4–7.7)
Neutrophils Relative %: 70.2 % (ref 43.0–77.0)
Platelets: 190 10*3/uL (ref 150.0–400.0)
RBC: 4.53 Mil/uL (ref 4.22–5.81)
RDW: 13.4 % (ref 11.5–15.5)
WBC: 7.7 10*3/uL (ref 4.0–10.5)

## 2023-07-23 LAB — BASIC METABOLIC PANEL WITH GFR
BUN: 16 mg/dL (ref 6–23)
CO2: 28 meq/L (ref 19–32)
Calcium: 8.8 mg/dL (ref 8.4–10.5)
Chloride: 108 meq/L (ref 96–112)
Creatinine, Ser: 1.14 mg/dL (ref 0.40–1.50)
GFR: 62.69 mL/min (ref >60.00)
Glucose, Bld: 93 mg/dL (ref 70–99)
Potassium: 3.8 meq/L (ref 3.5–5.1)
Sodium: 141 meq/L (ref 135–145)

## 2023-07-23 LAB — HEMOGLOBIN A1C: Hgb A1c MFr Bld: 5.9 % (ref 4.6–6.5)

## 2023-07-23 LAB — LIPID PANEL
Cholesterol: 144 mg/dL (ref 0–200)
HDL: 35 mg/dL — ABNORMAL LOW (ref >39.00)
LDL Cholesterol: 74 mg/dL (ref 0–99)
NonHDL: 108.98
Total CHOL/HDL Ratio: 4
Triglycerides: 177 mg/dL — ABNORMAL HIGH (ref 0.0–149.0)
VLDL: 35.4 mg/dL (ref 0.0–40.0)

## 2023-07-23 LAB — HEPATIC FUNCTION PANEL
ALT: 23 U/L (ref 0–53)
AST: 19 U/L (ref 0–37)
Albumin: 4.3 g/dL (ref 3.5–5.2)
Alkaline Phosphatase: 65 U/L (ref 39–117)
Bilirubin, Direct: 0.1 mg/dL (ref 0.0–0.3)
Total Bilirubin: 0.8 mg/dL (ref 0.2–1.2)
Total Protein: 6.8 g/dL (ref 6.0–8.3)

## 2023-07-23 LAB — TSH: TSH: 2.95 u[IU]/mL (ref 0.35–5.50)

## 2023-07-23 NOTE — Progress Notes (Unsigned)
 Patient ID: Benjamin Wilkerson, male   DOB: 08/09/1945, 78 y.o.   MRN: [id number]        Chief Complaint: follow up prostate ca, gross hematuria,, hld, low vit d, hyperglycemia, low b12       HPI:  Benjamin Wilkerson is a 78 y.o. male here with recent episode gross hematuria, has seen urology now for MRI abd, and cysto soon.  Denies urinary symptoms such as dysuria, frequency, urgency, flank pain, hematuria or n/v, fever, chills.  Pt denies chest pain, increased sob or doe, wheezing, orthopnea, PND, increased LE swelling, palpitations, dizziness or syncope.   Pt denies polydipsia, polyuria, or new focal neuro s/s.          Wt Readings from Last 3 Encounters:  07/23/23 252 lb (114.3 kg)  01/20/23 244 lb (110.7 kg)  12/16/22 230 lb (104.3 kg)   BP Readings from Last 3 Encounters:  07/23/23 124/70  01/20/23 120/70  07/21/22 126/80         Past Medical History:  Diagnosis Date   Hyperlipidemia    Hypertension    Prostate cancer [facility])    Past Surgical History:  Procedure Laterality Date   FRACTURE SURGERY     left wrist and arm    reports that he has never smoked. He has never used smokeless tobacco. He reports current alcohol use of about 7.0 - 14.0 standard drinks of alcohol per week. He reports that he does not use drugs. family history includes Alcohol abuse in his father; Diabetes in his mother; Heart disease in his father and mother. No Known Allergies Current Outpatient Medications on File Prior to Visit  Medication Sig Dispense Refill   amLODipine  (NORVASC ) 5 MG tablet Take 1 tablet (5 mg total) by mouth daily. 90 tablet 3   aspirin EC 81 MG tablet Take 81 mg by mouth daily.     atorvastatin  (LIPITOR) 80 MG tablet TAKE 1 TABLET BY MOUTH EVERY DAY 90 tablet 3   cholecalciferol (VITAMIN D3) 25 MCG (1000 UNIT) tablet Take 1,000 Units by mouth daily.     fluticasone  (FLONASE ) 50 MCG/ACT nasal spray Place 2 sprays into both nostrils daily. 16 g 6   metoprolol  succinate (TOPROL -XL) 50 MG  24 hr tablet TAKE 1 TABLET DAILY WITH OR IMMEDIATELY FOLLOWING A MEAL AS DIRECTED 90 tablet 3   nitroGLYCERIN  (NITROSTAT ) 0.4 MG SL tablet Place 1 tablet (0.4 mg total) under the tongue every 5 (five) minutes as needed for chest pain. 25 tablet 6   No current facility-administered medications on file prior to visit.        ROS:  All others reviewed and negative.  Objective        PE:  BP 124/70 (BP Location: Right Arm, Patient Position: Sitting, Cuff Size: Normal)   Pulse (!) 56   Temp 98.6 F (37 C) (Oral)   Ht 5' 10 (1.778 m)   Wt 252 lb (114.3 kg)   SpO2 97%   BMI 36.16 kg/m                 Constitutional: Pt appears in NAD               HENT: Head: NCAT.                Right Ear: External ear normal.                 Left Ear: External ear normal.  Eyes: . Pupils are equal, round, and reactive to light. Conjunctivae and EOM are normal               Nose: without d/c or deformity               Neck: Neck supple. Gross normal ROM               Cardiovascular: Normal rate and regular rhythm.                 Pulmonary/Chest: Effort normal and breath sounds without rales or wheezing.                Abd:  Soft, NT, ND, + BS, no organomegaly               Neurological: Pt is alert. At baseline orientation, motor grossly intact               Skin: Skin is warm. No rashes, no other new lesions, LE edema - none               Psychiatric: Pt behavior is normal without agitation   Micro: none  Cardiac tracings I have personally interpreted today:  none  Pertinent Radiological findings (summarize): none   Lab Results  Component Value Date   WBC 7.7 07/23/2023   HGB 15.0 07/23/2023   HCT 43.9 07/23/2023   PLT 190.0 07/23/2023   GLUCOSE 93 07/23/2023   CHOL 144 07/23/2023   TRIG 177.0 (H) 07/23/2023   HDL 35.00 (L) 07/23/2023   LDLDIRECT 187.8 02/04/2009   LDLCALC 74 07/23/2023   ALT 23 07/23/2023   AST 19 07/23/2023   NA 141 07/23/2023   K 3.8 07/23/2023    CL 108 07/23/2023   CREATININE 1.14 07/23/2023   BUN 16 07/23/2023   CO2 28 07/23/2023   TSH 2.95 07/23/2023   PSA 0.21 07/21/2022   HGBA1C 5.9 07/23/2023   Assessment/Plan:  Benjamin Wilkerson is a 78 y.o. White or Caucasian [1] male with  has a past medical history of Hyperlipidemia, Hypertension, and Prostate cancer (HCC).  Vitamin D  deficiency Last vitamin D  Lab Results  Component Value Date   VD25OH 28.94 (L) 07/21/2022   Low, to start oral replacement   Hyperglycemia Lab Results  Component Value Date   HGBA1C 5.9 07/23/2023   Stable, pt to continue current medical treatment  - diet,wt control   HLD (hyperlipidemia) Lab Results  Component Value Date   LDLCALC 74 07/23/2023   uncontrolled, pt to continue current statin liptor 80 mg every day, declines other change for now   Essential hypertension BP Readings from Last 3 Encounters:  07/23/23 124/70  01/20/23 120/70  07/21/22 126/80   Stable, pt to continue medical treatment norvasc  5 every day, toprol  xl 50 qd   B12 deficiency Lab Results  Component Value Date   VITAMINB12 622 07/21/2022   Stable, cont oral replacement - b12 1000 mcg qd  Followup: Return in about 6 months (around 01/22/2024).  Lynwood Rush, MD 07/25/2023 1:34 PM [facility] [place] [facility] - [facility] Medicine

## 2023-07-23 NOTE — Patient Instructions (Signed)
Please continue all other medications as before, and refills have been done if requested.  Please have the pharmacy call with any other refills you may need.  Please keep your appointments with your specialists as you may have planned  Please go to the LAB at the blood drawing area for the tests to be done  You will be contacted by phone if any changes need to be made immediately.  Otherwise, you will receive a letter about your results with an explanation, but please check with MyChart first.

## 2023-07-25 ENCOUNTER — Encounter: Payer: Self-pay | Admitting: Internal Medicine

## 2023-07-25 DIAGNOSIS — N183 Chronic kidney disease, stage 3 unspecified: Secondary | ICD-10-CM | POA: Insufficient documentation

## 2023-07-25 NOTE — Assessment & Plan Note (Signed)
Last vitamin D Lab Results  Component Value Date   VD25OH 28.94 (L) 07/21/2022   Low, to start oral replacement

## 2023-07-25 NOTE — Assessment & Plan Note (Signed)
 Lab Results  Component Value Date   HGBA1C 5.9 07/23/2023   Stable, pt to continue current medical treatment  - diet,wt control

## 2023-07-25 NOTE — Assessment & Plan Note (Signed)
 BP Readings from Last 3 Encounters:  07/23/23 124/70  01/20/23 120/70  07/21/22 126/80   Stable, pt to continue medical treatment norvasc  5 every day, toprol  xl 50 qd

## 2023-07-25 NOTE — Assessment & Plan Note (Signed)
 Lab Results  Component Value Date   LDLCALC 74 07/23/2023   uncontrolled, pt to continue current statin liptor 80 mg every day, declines other change for now

## 2023-07-25 NOTE — Assessment & Plan Note (Signed)
Lab Results  Component Value Date   VITAMINB12 622 07/21/2022   Stable, cont oral replacement - b12 1000 mcg qd

## 2023-08-09 DIAGNOSIS — R310 Gross hematuria: Secondary | ICD-10-CM | POA: Diagnosis not present

## 2023-08-09 DIAGNOSIS — C61 Malignant neoplasm of prostate: Secondary | ICD-10-CM | POA: Diagnosis not present

## 2023-10-14 ENCOUNTER — Ambulatory Visit: Payer: Self-pay

## 2023-10-14 NOTE — Telephone Encounter (Signed)
 FYI Only or Action Required?: FYI only for provider.  Patient was last seen in primary care on 07/23/2023 by Norleen Lynwood ORN, MD.  Called Nurse Triage reporting Leg Swelling.  Symptoms began several months ago - recently worsening. Right side more swollen with red splotches  Interventions attempted: Nothing.  Symptoms are: unchanged.  Triage Disposition: See HCP Within 4 Hours (Or PCP Triage)  Patient/caregiver understands and will follow disposition?: no - pt made appt for tomorrow afternoon                Copied from CRM #[id number]. Topic: Clinical - Red Word Triage >> Oct 14, 2023 11:12 AM Berneda FALCON wrote: Red Word that prompted transfer to Nurse Triage: Patient states he has swelling in both legs and ankles but moreso on the right. Swelling is constant but has been worse over the past 2-3 months. Noticed red splotches. The right leg has the skin really tight and red splotches. Reason for Disposition  [1] Thigh, calf, or ankle swelling AND [2] bilateral AND [3] 1 side is more swollen  Answer Assessment - Initial Assessment Questions 1. ONSET: When did the swelling start? (e.g., minutes, hours, days)     Ongoing - last couple of days has gotten worse 2. LOCATION: What part of the leg is swollen?  Are both legs swollen or just one leg?     Both legs 3. SEVERITY: How bad is the swelling? (e.g., localized; mild, moderate, severe)     moderate 4. REDNESS: Is there redness or signs of infection?     Yes - right leg has splotches 5. PAIN: Is the swelling painful to touch? If Yes, ask: How painful is it?   (Scale 1-10; mild, moderate or severe)     No pain 6. FEVER: Do you have a fever? If Yes, ask: What is it, how was it measured, and when did it start?      no 7. CAUSE: What do you think is causing the leg swelling?     Old age. 8. MEDICAL HISTORY: Do you have a history of blood clots (e.g., DVT), cancer, heart failure, kidney disease, or liver  failure?     Prostrate cancer 9. RECURRENT SYMPTOM: Have you had leg swelling before? If Yes, ask: When was the last time? What happened that time?     yes 10. OTHER SYMPTOMS: Do you have any other symptoms? (e.g., chest pain, difficulty breathing)       no  Protocols used: Leg Swelling and Edema-A-AH

## 2023-10-15 ENCOUNTER — Ambulatory Visit (INDEPENDENT_AMBULATORY_CARE_PROVIDER_SITE_OTHER): Admitting: Internal Medicine

## 2023-10-15 ENCOUNTER — Encounter: Payer: Self-pay | Admitting: Internal Medicine

## 2023-10-15 VITALS — BP 128/58 | HR 59 | Temp 98.3°F | Ht 70.0 in | Wt 252.0 lb

## 2023-10-15 DIAGNOSIS — R600 Localized edema: Secondary | ICD-10-CM | POA: Diagnosis not present

## 2023-10-15 DIAGNOSIS — E559 Vitamin D deficiency, unspecified: Secondary | ICD-10-CM

## 2023-10-15 DIAGNOSIS — R739 Hyperglycemia, unspecified: Secondary | ICD-10-CM | POA: Diagnosis not present

## 2023-10-15 DIAGNOSIS — I10 Essential (primary) hypertension: Secondary | ICD-10-CM | POA: Diagnosis not present

## 2023-10-15 DIAGNOSIS — E538 Deficiency of other specified B group vitamins: Secondary | ICD-10-CM

## 2023-10-15 DIAGNOSIS — L03115 Cellulitis of right lower limb: Secondary | ICD-10-CM | POA: Diagnosis not present

## 2023-10-15 DIAGNOSIS — I2510 Atherosclerotic heart disease of native coronary artery without angina pectoris: Secondary | ICD-10-CM

## 2023-10-15 MED ORDER — CEPHALEXIN 500 MG PO CAPS: 500.0000 mg | ORAL_CAPSULE | Freq: Three times a day (TID) | ORAL | 0 refills | Status: AC

## 2023-10-15 MED ORDER — FUROSEMIDE 20 MG PO TABS: 20.0000 mg | ORAL_TABLET | Freq: Every day | ORAL | 11 refills | Status: DC

## 2023-10-15 MED ORDER — ATORVASTATIN CALCIUM 80 MG PO TABS: 80.0000 mg | ORAL_TABLET | Freq: Every day | ORAL | 3 refills | Status: DC

## 2023-10-15 MED ORDER — METOPROLOL SUCCINATE ER 50 MG PO TB24: ORAL_TABLET | ORAL | 3 refills | Status: DC

## 2023-10-15 MED ORDER — AMLODIPINE BESYLATE 5 MG PO TABS: 5.0000 mg | ORAL_TABLET | Freq: Every day | ORAL | 3 refills | Status: DC

## 2023-10-15 NOTE — Progress Notes (Unsigned)
 Patient ID: Benjamin Wilkerson, male   DOB: [birth year], 78 y.o.   MRN: [id number]        Chief Complaint: follow up right leg cellulitis x 3 days, bilateral leg edema x 3 mo, htn, hyperglycemia, low vit d and b12       HPI:  Benjamin Wilkerson is a 78 y.o. male here with c/o 3 mo gradually worsening bilateral leg swelling left > right, but now has right > left swelling it seems x 3 days with redness, tender heat to mid lateral leg area.  No ulcer or drainage.  Pt denies chest pain, increased sob or doe, wheezing, orthopnea, PND, palpitations, dizziness or syncope.   Pt denies polydipsia, polyuria, or new focal neuro s/s.     No fever,  chills       Wt Readings from Last 3 Encounters:  10/15/23 252 lb (114.3 kg)  07/23/23 252 lb (114.3 kg)  01/20/23 244 lb (110.7 kg)   BP Readings from Last 3 Encounters:  10/15/23 (!) 128/58  07/23/23 124/70  01/20/23 120/70         Past Medical History:  Diagnosis Date   Hyperlipidemia    Hypertension    Prostate cancer [facility])    Past Surgical History:  Procedure Laterality Date   FRACTURE SURGERY     left wrist and arm    reports that he has never smoked. He has never used smokeless tobacco. He reports current alcohol use of about 7.0 - 14.0 standard drinks of alcohol per week. He reports that he does not use drugs. family history includes Alcohol abuse in his father; Diabetes in his mother; Heart disease in his father and mother. No Known Allergies Current Outpatient Medications on File Prior to Visit  Medication Sig Dispense Refill   aspirin EC 81 MG tablet Take 81 mg by mouth daily.     cholecalciferol (VITAMIN D3) 25 MCG (1000 UNIT) tablet Take 1,000 Units by mouth daily.     fluticasone  (FLONASE ) 50 MCG/ACT nasal spray Place 2 sprays into both nostrils daily. 16 g 6   nitroGLYCERIN  (NITROSTAT ) 0.4 MG SL tablet Place 1 tablet (0.4 mg total) under the tongue every 5 (five) minutes as needed for chest pain. 25 tablet 6   No current  facility-administered medications on file prior to visit.        ROS:  All others reviewed and negative.  Objective        PE:  BP (!) 128/58   Pulse (!) 59   Temp 98.3 F (36.8 C)   Ht 5' 10 (1.778 m)   Wt 252 lb (114.3 kg)   SpO2 99%   BMI 36.16 kg/m                 Constitutional: Pt appears in NAD               HENT: Head: NCAT.                Right Ear: External ear normal.                 Left Ear: External ear normal.                Eyes: . Pupils are equal, round, and reactive to light. Conjunctivae and EOM are normal               Nose: without d/c or deformity  Neck: Neck supple. Gross normal ROM               Cardiovascular: Normal rate and regular rhythm.                 Pulmonary/Chest: Effort normal and breath sounds without rales or wheezing.                Abd:  Soft, NT, ND, + BS, no organomegaly               Neurological: Pt is alert. At baseline orientation, motor grossly intact               Skin: Skin is warm. No rashes, no other new lesions, LE edema - large 3+ swelling RLE with lateral mid leg with approx 4 cm area red, tender, swelling., also LLE with nontender 1-2+ edema to below the knee               Psychiatric: Pt behavior is normal without agitation   Micro: none  Cardiac tracings I have personally interpreted today:  none  Pertinent Radiological findings (summarize): none   Lab Results  Component Value Date   WBC 7.7 07/23/2023   HGB 15.0 07/23/2023   HCT 43.9 07/23/2023   PLT 190.0 07/23/2023   GLUCOSE 93 07/23/2023   CHOL 144 07/23/2023   TRIG 177.0 (H) 07/23/2023   HDL 35.00 (L) 07/23/2023   LDLDIRECT 187.8 02/04/2009   LDLCALC 74 07/23/2023   ALT 23 07/23/2023   AST 19 07/23/2023   NA 141 07/23/2023   K 3.8 07/23/2023   CL 108 07/23/2023   CREATININE 1.14 07/23/2023   BUN 16 07/23/2023   CO2 28 07/23/2023   TSH 2.95 07/23/2023   PSA 0.21 07/21/2022   HGBA1C 5.9 07/23/2023   Assessment/Plan:  Benjamin Wilkerson  is a 78 y.o. White or Caucasian [1] male with  has a past medical history of Hyperlipidemia, Hypertension, and Prostate cancer (HCC).  Cellulitis of right leg Mild to mod, for antibx course cephalexin  500 tid course,  to f/u any worsening symptoms or concerns  Essential hypertension BP Readings from Last 3 Encounters:  10/15/23 (!) 128/58  07/23/23 124/70  01/20/23 120/70   Stable, pt to continue medical treatment norvasc  5 every day, toprol  xl 50 qd   Hyperglycemia Lab Results  Component Value Date   HGBA1C 5.9 07/23/2023   Stable, pt to continue current medical treatment  - diet, wt control   Peripheral edema I suspect possible cardiac related - for lasix  20 every day, Echo, refer cardiology, f/u 2 - 4 wks  B12 deficiency Lab Results  Component Value Date   VITAMINB12 622 07/21/2022   Stable, cont oral replacement - b12 1000 mcg qd  Followup: Return in about 4 weeks (around 11/12/2023).  Lynwood Rush, MD 10/16/2023 2:20 PM [facility] [place] [facility] - [facility] Internal Medicine

## 2023-10-15 NOTE — Patient Instructions (Signed)
 Please take all new medication as prescribed - the lasix  20 mg per day  Please take all new medication as prescribed - the antibiotic  Please continue all other medications as before, and refills have been done if requested.  Please have the pharmacy call with any other refills you may need.  Please keep your appointments with your specialists as you may have planned  You will be contacted regarding the referral for: Echocardiogram, and Cardiology  Please make an Appointment to return in 1 months, or sooner if needed

## 2023-10-16 ENCOUNTER — Encounter: Payer: Self-pay | Admitting: Internal Medicine

## 2023-10-16 NOTE — Assessment & Plan Note (Signed)
Mild to mod, for antibx course - cephalexin 500 tid course,  to f/u any worsening symptoms or concerns

## 2023-10-16 NOTE — Assessment & Plan Note (Signed)
 BP Readings from Last 3 Encounters:  10/15/23 (!) 128/58  07/23/23 124/70  01/20/23 120/70   Stable, pt to continue medical treatment norvasc  5 every day, toprol  xl 50 qd

## 2023-10-16 NOTE — Assessment & Plan Note (Signed)
Lab Results  Component Value Date   VITAMINB12 622 07/21/2022   Stable, cont oral replacement - b12 1000 mcg qd

## 2023-10-16 NOTE — Assessment & Plan Note (Signed)
 Lab Results  Component Value Date   HGBA1C 5.9 07/23/2023   Stable, pt to continue current medical treatment  - diet,wt control

## 2023-10-16 NOTE — Assessment & Plan Note (Addendum)
 I suspect possible cardiac related - for lasix  20 every day, Echo, refer cardiology, f/u 2 - 4 wks

## 2023-10-19 ENCOUNTER — Ambulatory Visit: Attending: Cardiology

## 2023-10-19 ENCOUNTER — Ambulatory Visit: Payer: Self-pay | Admitting: Internal Medicine

## 2023-10-19 DIAGNOSIS — R600 Localized edema: Secondary | ICD-10-CM | POA: Diagnosis not present

## 2023-10-19 LAB — ECHOCARDIOGRAM COMPLETE
AR max vel: 3.6 cm2
AV Area VTI: 3.78 cm2
AV Area mean vel: 3.58 cm2
AV Mean grad: 3 mmHg
AV Peak grad: 5.8 mmHg
Ao pk vel: 1.2 m/s
Area-P 1/2: 3.45 cm2
MV VTI: 2.13 cm2
S' Lateral: 3.1 cm

## 2023-10-21 NOTE — Telephone Encounter (Signed)
 Patient has since been seen within the office

## 2023-11-16 ENCOUNTER — Ambulatory Visit (INDEPENDENT_AMBULATORY_CARE_PROVIDER_SITE_OTHER): Admitting: Internal Medicine

## 2023-11-16 ENCOUNTER — Encounter: Payer: Self-pay | Admitting: Internal Medicine

## 2023-11-16 ENCOUNTER — Ambulatory Visit: Payer: Self-pay | Admitting: Internal Medicine

## 2023-11-16 VITALS — BP 126/82 | HR 60 | Temp 98.2°F | Ht 70.0 in | Wt 255.0 lb

## 2023-11-16 DIAGNOSIS — E559 Vitamin D deficiency, unspecified: Secondary | ICD-10-CM

## 2023-11-16 DIAGNOSIS — E538 Deficiency of other specified B group vitamins: Secondary | ICD-10-CM

## 2023-11-16 DIAGNOSIS — R600 Localized edema: Secondary | ICD-10-CM | POA: Diagnosis not present

## 2023-11-16 DIAGNOSIS — L03115 Cellulitis of right lower limb: Secondary | ICD-10-CM | POA: Diagnosis not present

## 2023-11-16 DIAGNOSIS — R739 Hyperglycemia, unspecified: Secondary | ICD-10-CM

## 2023-11-16 DIAGNOSIS — I10 Essential (primary) hypertension: Secondary | ICD-10-CM | POA: Diagnosis not present

## 2023-11-16 DIAGNOSIS — Z23 Encounter for immunization: Secondary | ICD-10-CM

## 2023-11-16 LAB — BASIC METABOLIC PANEL WITH GFR
BUN: 14 mg/dL (ref 6–23)
CO2: 28 meq/L (ref 19–32)
Calcium: 8.6 mg/dL (ref 8.4–10.5)
Chloride: 103 meq/L (ref 96–112)
Creatinine, Ser: 1.23 mg/dL (ref 0.40–1.50)
GFR: 57.1 mL/min — ABNORMAL LOW (ref >60.00)
Glucose, Bld: 112 mg/dL — ABNORMAL HIGH (ref 70–99)
Potassium: 3.9 meq/L (ref 3.5–5.1)
Sodium: 138 meq/L (ref 135–145)

## 2023-11-16 MED ORDER — AMLODIPINE BESYLATE 5 MG PO TABS: 5.0000 mg | ORAL_TABLET | Freq: Every day | ORAL | 3 refills | Status: AC

## 2023-11-16 MED ORDER — ATORVASTATIN CALCIUM 80 MG PO TABS: 80.0000 mg | ORAL_TABLET | Freq: Every day | ORAL | 3 refills | Status: AC

## 2023-11-16 MED ORDER — FUROSEMIDE 20 MG PO TABS: ORAL_TABLET | ORAL | 11 refills | Status: DC

## 2023-11-16 MED ORDER — FUROSEMIDE 20 MG PO TABS: ORAL_TABLET | ORAL | 11 refills | Status: AC

## 2023-11-16 MED ORDER — METOPROLOL SUCCINATE ER 50 MG PO TB24: ORAL_TABLET | ORAL | 3 refills | Status: AC

## 2023-11-16 MED ORDER — FLUTICASONE PROPIONATE 50 MCG/ACT NA SUSP: 2.0000 | Freq: Every day | NASAL | 6 refills | Status: AC

## 2023-11-16 NOTE — Progress Notes (Signed)
 Patient ID: Benjamin Wilkerson, male   DOB: 29-Sep-1945, 78 y.o.   MRN: [id number]        Chief Complaint: follow up leg edema, cellulitis, low b12, low vit d, htn, hyperglycemia       HPI:  Benjamin Wilkerson is a 78 y.o. male here overall doing well with all celluitis resolved, and swelling right > left legs much improved but still some mild swelling persists.  No pain, fever, and good compliance with meds, though does make me go.  Pt denies chest pain, wheezing, orthopnea, PND, increased LE swelling, palpitations, dizziness or syncope.   Pt denies polydipsia, polyuria, or new focal neuro s/s.    Pt denies fever, wt loss, night sweats, loss of appetite, or other constitutional symptoms    Wt Readings from Last 3 Encounters:  11/16/23 255 lb (115.7 kg)  10/15/23 252 lb (114.3 kg)  07/23/23 252 lb (114.3 kg)   BP Readings from Last 3 Encounters:  11/16/23 126/82  10/15/23 (!) 128/58  07/23/23 124/70         Past Medical History:  Diagnosis Date   Hyperlipidemia    Hypertension    Prostate cancer [facility])    Past Surgical History:  Procedure Laterality Date   FRACTURE SURGERY     left wrist and arm    reports that he has never smoked. He has never used smokeless tobacco. He reports current alcohol use of about 7.0 - 14.0 standard drinks of alcohol per week. He reports that he does not use drugs. family history includes Alcohol abuse in his father; Diabetes in his mother; Heart disease in his father and mother. No Known Allergies Current Outpatient Medications on File Prior to Visit  Medication Sig Dispense Refill   aspirin EC 81 MG tablet Take 81 mg by mouth daily.     cholecalciferol (VITAMIN D3) 25 MCG (1000 UNIT) tablet Take 1,000 Units by mouth daily.     nitroGLYCERIN  (NITROSTAT ) 0.4 MG SL tablet Place 1 tablet (0.4 mg total) under the tongue every 5 (five) minutes as needed for chest pain. 25 tablet 6   cephALEXin  (KEFLEX ) 500 MG capsule Take 1 capsule (500 mg total) by mouth 3  (three) times daily. (Patient not taking: Reported on 11/16/2023) 30 capsule 0   No current facility-administered medications on file prior to visit.        ROS:  All others reviewed and negative.  Objective        PE:  BP 126/82 (BP Location: Right Arm, Patient Position: Sitting, Cuff Size: Normal)   Pulse 60   Temp 98.2 F (36.8 C) (Oral)   Ht 5' 10 (1.778 m)   Wt 255 lb (115.7 kg)   SpO2 98%   BMI 36.59 kg/m                 Constitutional: Pt appears in NAD               HENT: Head: NCAT.                Right Ear: External ear normal.                 Left Ear: External ear normal.                Eyes: . Pupils are equal, round, and reactive to light. Conjunctivae and EOM are normal               Nose: without d/c  or deformity               Neck: Neck supple. Gross normal ROM               Cardiovascular: Normal rate and regular rhythm.                 Pulmonary/Chest: Effort normal and breath sounds without rales or wheezing.                Abd:  Soft, NT, ND, + BS, no organomegaly               Neurological: Pt is alert. At baseline orientation, motor grossly intact               Skin: Skin is warm. No rashes, no other new lesions, LE edema - trace to 1+ bilateral right > left               Psychiatric: Pt behavior is normal without agitation   Micro: none  Cardiac tracings I have personally interpreted today:  none  Pertinent Radiological findings (summarize): none   Lab Results  Component Value Date   WBC 7.7 07/23/2023   HGB 15.0 07/23/2023   HCT 43.9 07/23/2023   PLT 190.0 07/23/2023   GLUCOSE 112 (H) 11/16/2023   CHOL 144 07/23/2023   TRIG 177.0 (H) 07/23/2023   HDL 35.00 (L) 07/23/2023   LDLDIRECT 187.8 02/04/2009   LDLCALC 74 07/23/2023   ALT 23 07/23/2023   AST 19 07/23/2023   NA 138 11/16/2023   K 3.9 11/16/2023   CL 103 11/16/2023   CREATININE 1.23 11/16/2023   BUN 14 11/16/2023   CO2 28 11/16/2023   TSH 2.95 07/23/2023   PSA 0.21 07/21/2022    HGBA1C 5.9 07/23/2023   Assessment/Plan:  Benjamin Wilkerson is a 78 y.o. White or Caucasian [1] male with  has a past medical history of Hyperlipidemia, Hypertension, and Prostate cancer (HCC).  Peripheral edema Echo with normal EF and mild LVH o/w unremarkable; edema improved but still mild uncontrolled - ok for change lasix  20 qam and 20 qpm prn persistent swelling, for BMP today  B12 deficiency Lab Results  Component Value Date   VITAMINB12 622 07/21/2022   Stable, cont oral replacement - b12 1000 mcg qd   Cellulitis of right leg Clinically resolved, no further tx needed  Essential hypertension BP Readings from Last 3 Encounters:  11/16/23 126/82  10/15/23 (!) 128/58  07/23/23 124/70   Stable, pt to continue medical treatment norvasc  5 every day, toprol  xl 50 qd   Hyperglycemia Lab Results  Component Value Date   HGBA1C 5.9 07/23/2023   Stable, pt to continue current medical treatment  - diet, wt control   Vitamin D  deficiency Last vitamin D  Lab Results  Component Value Date   VD25OH 28.94 (L) 07/21/2022   Low, to start oral replacement  Followup: Return in about 6 months (around 05/16/2024).  Benjamin Rush, MD 11/16/2023 7:39 PM [facility] [place] [facility] - [facility] Internal Medicine

## 2023-11-16 NOTE — Assessment & Plan Note (Signed)
 Clinically resolved, no further tx needed

## 2023-11-16 NOTE — Assessment & Plan Note (Signed)
 Echo with normal EF and mild LVH o/w unremarkable; edema improved but still mild uncontrolled - ok for change lasix  20 qam and 20 qpm prn persistent swelling, for BMP today

## 2023-11-16 NOTE — Patient Instructions (Addendum)
 Ok to change the lasix  to 20 mg in the AM, but also 20 mg in the PM if you have persistent swelling, or wt gain more than 3-5 lbs in 1-2 days.    Please continue all other medications as before, and refills have been done if requested.  Please have the pharmacy call with any other refills you may need.  Please continue your efforts at being more active, low cholesterol diet, and weight control.  Please keep your appointments with your specialists as you may have planned  Please go to the LAB at the blood drawing area for the tests to be done  You will be contacted by phone if any changes need to be made immediately.  Otherwise, you will receive a letter about your results with an explanation, but please check with MyChart first.  Please make an Appointment to return in 6 months, or sooner if needed

## 2023-11-16 NOTE — Assessment & Plan Note (Signed)
Last vitamin D Lab Results  Component Value Date   VD25OH 28.94 (L) 07/21/2022   Low, to start oral replacement

## 2023-11-16 NOTE — Assessment & Plan Note (Signed)
 Lab Results  Component Value Date   HGBA1C 5.9 07/23/2023   Stable, pt to continue current medical treatment  - diet,wt control

## 2023-11-16 NOTE — Assessment & Plan Note (Signed)
Lab Results  Component Value Date   VITAMINB12 622 07/21/2022   Stable, cont oral replacement - b12 1000 mcg qd

## 2023-11-16 NOTE — Assessment & Plan Note (Signed)
 BP Readings from Last 3 Encounters:  11/16/23 126/82  10/15/23 (!) 128/58  07/23/23 124/70   Stable, pt to continue medical treatment norvasc  5 every day, toprol  xl 50 qd

## 2023-12-28 NOTE — Progress Notes (Unsigned)
 Cardiology Office Note   Date:  12/29/2023  ID:  Benjamin, Wilkerson Oct 28, 1945, MRN [id number] PCP: Benjamin Lynwood LELON, MD  [facility] Providers Cardiologist:  Benjamin Reek, MD   History of Present Illness Benjamin Wilkerson is a 78 y.o. male with a PMH of CAD which is nonobstructive.   He is seen today for coronary artery disease who presents for a routine follow-up.  He had a stress test in February of the previous year that showed abnormal perfusion, and his prior doctor told him this could represent a prior infarct in the right coronary or circumflex territory. He was asymptomatic at that time without chest pain or shortness of breath and remained active. Cardiac catheterization was discussed but deferred. An echocardiogram in [place] showed normal systolic function with mild left ventricular thickening.  After retirement he gained weight and reduced physical activity, with increased fatigue and exertional shortness of breath. Since starting part-time work his exercise tolerance has improved. He initially lost 30 pounds but regained it and now weighs 256 pounds. He wants to improve diet and increase activity for weight loss.  He has bilateral leg swelling, worse in the right leg. The right leg was red and more swollen a few months ago. Diuretics were started at one pill daily and recently increased to two pills daily for edema. He denies leg tenderness.  He has prostate cancer under active care and a past episode of hematuria attributed to a passed kidney stone, which resolved spontaneously without pain.  He is taking diuretics for leg swelling and has high blood pressure.   Reports no shortness of breath nor dyspnea on exertion. Reports no chest pain, pressure, or tightness. No edema, orthopnea, PND. Reports no palpitations.   Discussed the use of AI scribe software for clinical note transcription with the patient, who gave verbal consent to proceed.   ROS: pertinent ROS  in HPI  Studies Reviewed EKG Interpretation Date/Time:  Wednesday December 29 2023 15:15:09 EST Ventricular Rate:  58 PR Interval:  226 QRS Duration:  92 QT Interval:  430 QTC Calculation: 422 R Axis:   0  Text Interpretation: Sinus bradycardia with 1st degree A-V block Inferior infarct , age undetermined No previous ECGs available Confirmed by Benjamin Wilkerson [phone]) on 12/29/2023 4:52:48 PM    Calcium  scoring CT in December 2023 showed: Coronary Calcium  Score:   Left main: 16.2   Left anterior descending artery: 183   Left circumflex artery: 50.1   Right coronary artery: 634   Total: 884   Percentile: 77th   Pericardium: Normal.   Non-cardiac: See separate report from [facility].   IMPRESSION: 1. Coronary calcium  score of 884. This was 77th percentile for age-, race-, and sex-matched controls.   Hospitalized in 11/23 at [facility] with confusion.  Transient global amnesia.  No further episodes.   Reports some DOE.     Younger brother had a stent.       Physical Exam VS:  BP 120/70   Pulse (!) 59   Ht 5' 10 (1.778 m)   Wt 256 lb (116.1 kg)   SpO2 97%   BMI 36.73 kg/m        Wt Readings from Last 3 Encounters:  12/29/23 256 lb (116.1 kg)  11/16/23 255 lb (115.7 kg)  10/15/23 252 lb (114.3 kg)    GEN: Well nourished, well developed in no acute distress NECK: No JVD; No carotid bruits CARDIAC: RRR, no murmurs, rubs, gallops RESPIRATORY:  Clear to auscultation without rales, wheezing or rhonchi  ABDOMEN: Soft, non-tender, non-distended EXTREMITIES:  2+ bilateral pitting edema up to his knees; No deformity   ASSESSMENT AND PLAN  Chronic lower extremity edema Bilateral pitting edema, more on right. Possible heart, kidney, and dietary salt factors. Current diuretic may be insufficient. - Increased Lasix  to 40 mg twice daily for 7 days. - Ordered repeat kidney function tests in 2 weeks. - Advised low sodium diet. - Recommended leg  elevation. - Suggested wearing compression socks. -daily weights  Atherosclerotic heart disease of native coronary artery Previous stress test showed abnormal perfusion, intermediate coronary artery disease risk. Echocardiogram normal with mild left ventricular hypertrophy, likely from hypertension. - Continue current management and monitoring. - Encouraged weight loss and increased physical activity.  Essential hypertension Mild left ventricular hypertrophy likely from long-standing hypertension.  Morbid obesity Weight at 256 lbs. Previous weight loss regained. Obesity affects exercise tolerance and fatigue. Weight loss could improve health. - Encouraged adherence to a Mediterranean diet. - Advised increasing physical activity, including organized exercise on off days.  Mixed hyperlipidemia LDL at 74 mg/dL, within target. Triglycerides slightly elevated at 177 mg/dL. Total cholesterol and HDL satisfactory. - Encouraged dietary modifications to further reduce triglycerides.     Dispo: He can return in three months to check fluid status.  Signed, Benjamin LOISE Fabry, PA-C

## 2023-12-29 ENCOUNTER — Encounter: Payer: Self-pay | Admitting: Physician Assistant

## 2023-12-29 ENCOUNTER — Ambulatory Visit: Attending: Physician Assistant | Admitting: Physician Assistant

## 2023-12-29 VITALS — BP 120/70 | HR 59 | Ht 70.0 in | Wt 256.0 lb

## 2023-12-29 DIAGNOSIS — R0609 Other forms of dyspnea: Secondary | ICD-10-CM | POA: Diagnosis not present

## 2023-12-29 DIAGNOSIS — E782 Mixed hyperlipidemia: Secondary | ICD-10-CM | POA: Insufficient documentation

## 2023-12-29 DIAGNOSIS — I10 Essential (primary) hypertension: Secondary | ICD-10-CM | POA: Insufficient documentation

## 2023-12-29 DIAGNOSIS — I2510 Atherosclerotic heart disease of native coronary artery without angina pectoris: Secondary | ICD-10-CM | POA: Insufficient documentation

## 2023-12-29 NOTE — Patient Instructions (Addendum)
 Medication Instructions:  INCREASE Lasix  to 40mg  Take 2 20mg   tablet twice a day for 7 days then go back to 1 tablet twice a day  *If you need a refill on your cardiac medications before your next appointment, please call your pharmacy*  Lab Work: 2 WEEKS BMET (01/12/24) If you have labs (blood work) drawn today and your tests are completely normal, you will receive your results only by: MyChart Message (if you have MyChart) OR A paper copy in the mail If you have any lab test that is abnormal or we need to change your treatment, we will call you to review the results.  Testing/Procedures: NONE ORDERED  Follow-Up: At [facility], you and your health needs are our priority.  As part of our continuing mission to provide you with exceptional heart care, our providers are all part of one team.  This team includes your primary Cardiologist (physician) and Advanced Practice Providers or APPs (Physician Assistants and Nurse Practitioners) who all work together to provide you with the care you need, when you need it.  Your next appointment:   3 month(s)  Provider:   [name], PA-C        We recommend signing up for the patient portal called MyChart.  Sign up information is provided on this After Visit Summary.  MyChart is used to connect with patients for Virtual Visits (Telemedicine).  Patients are able to view lab/test results, encounter notes, upcoming appointments, etc.  Non-urgent messages can be sent to your provider as well.   To learn more about what you can do with MyChart, go to [email or web address].   Other Instructions Please get some ted hose or compression stockings. They can be purchased at your local medical supply store, [employer], [employer] or charity fundraiser.  Put them on first thing in the morning and wear them during the day . Elevate your feet during the day and remove hose in the evening before bed.  Track your weight.

## 2024-01-14 DIAGNOSIS — I2510 Atherosclerotic heart disease of native coronary artery without angina pectoris: Secondary | ICD-10-CM | POA: Diagnosis not present

## 2024-01-14 DIAGNOSIS — E782 Mixed hyperlipidemia: Secondary | ICD-10-CM | POA: Diagnosis not present

## 2024-01-14 DIAGNOSIS — I10 Essential (primary) hypertension: Secondary | ICD-10-CM | POA: Diagnosis not present

## 2024-01-14 DIAGNOSIS — R0609 Other forms of dyspnea: Secondary | ICD-10-CM | POA: Diagnosis not present

## 2024-01-15 LAB — BASIC METABOLIC PANEL WITH GFR
BUN/Creatinine Ratio: 14 (ref 10–24)
BUN: 16 mg/dL (ref 8–27)
CO2: 25 mmol/L (ref 20–29)
Calcium: 9.2 mg/dL (ref 8.6–10.2)
Chloride: 101 mmol/L (ref 96–106)
Creatinine, Ser: 1.16 mg/dL (ref 0.76–1.27)
Glucose: 112 mg/dL — ABNORMAL HIGH (ref 70–99)
Potassium: 4 mmol/L (ref 3.5–5.2)
Sodium: 141 mmol/L (ref 134–144)
eGFR: 65 mL/min/1.73 (ref >59)

## 2024-01-17 ENCOUNTER — Ambulatory Visit: Payer: Self-pay | Admitting: Physician Assistant

## 2024-03-28 ENCOUNTER — Ambulatory Visit: Admitting: Physician Assistant

## 2024-04-03 ENCOUNTER — Ambulatory Visit

## 2024-05-15 ENCOUNTER — Ambulatory Visit: Admitting: Internal Medicine
